# Patient Record
Sex: Female | Born: 1965
Health system: Southern US, Community
[De-identification: ages and names within clinical notes are randomized; demographics above are authoritative.]

## PROBLEM LIST (undated history)

## (undated) DIAGNOSIS — J302 Other seasonal allergic rhinitis: Secondary | ICD-10-CM

## (undated) DIAGNOSIS — Z87442 Personal history of urinary calculi: Secondary | ICD-10-CM

## (undated) DIAGNOSIS — T7840XA Allergy, unspecified, initial encounter: Secondary | ICD-10-CM

## (undated) DIAGNOSIS — K219 Gastro-esophageal reflux disease without esophagitis: Secondary | ICD-10-CM

## (undated) DIAGNOSIS — E785 Hyperlipidemia, unspecified: Secondary | ICD-10-CM

## (undated) HISTORY — DX: Allergy, unspecified, initial encounter: T78.40XA

## (undated) HISTORY — DX: Hyperlipidemia, unspecified: E78.5

## (undated) HISTORY — DX: Gastro-esophageal reflux disease without esophagitis: K21.9

## (undated) HISTORY — DX: Other seasonal allergic rhinitis: J30.2

---

## 1995-01-11 HISTORY — PX: TOTAL ABDOMINAL HYSTERECTOMY: SHX209

## 2000-02-09 ENCOUNTER — Encounter: Payer: Self-pay | Admitting: Family Medicine

## 2000-02-09 ENCOUNTER — Encounter: Admission: RE | Admit: 2000-02-09 | Discharge: 2000-02-09 | Payer: Self-pay | Admitting: Family Medicine

## 2003-02-27 ENCOUNTER — Ambulatory Visit (HOSPITAL_COMMUNITY): Admission: RE | Admit: 2003-02-27 | Discharge: 2003-02-27 | Payer: Self-pay | Admitting: Neurology

## 2003-10-08 ENCOUNTER — Encounter: Admission: RE | Admit: 2003-10-08 | Discharge: 2003-10-08 | Payer: Self-pay | Admitting: Obstetrics and Gynecology

## 2003-12-09 ENCOUNTER — Ambulatory Visit: Payer: Self-pay | Admitting: Family Medicine

## 2004-01-22 ENCOUNTER — Encounter: Admission: RE | Admit: 2004-01-22 | Discharge: 2004-01-22 | Payer: Self-pay | Admitting: *Deleted

## 2004-02-06 ENCOUNTER — Ambulatory Visit: Payer: Self-pay | Admitting: Family Medicine

## 2004-05-06 ENCOUNTER — Ambulatory Visit: Payer: Self-pay | Admitting: Family Medicine

## 2004-09-23 ENCOUNTER — Ambulatory Visit: Payer: Self-pay | Admitting: Family Medicine

## 2004-09-27 ENCOUNTER — Ambulatory Visit (HOSPITAL_COMMUNITY): Admission: RE | Admit: 2004-09-27 | Discharge: 2004-09-27 | Payer: Self-pay | Admitting: Family Medicine

## 2004-10-27 ENCOUNTER — Encounter: Admission: RE | Admit: 2004-10-27 | Discharge: 2004-10-27 | Payer: Self-pay | Admitting: Obstetrics and Gynecology

## 2004-11-11 ENCOUNTER — Ambulatory Visit: Payer: Self-pay | Admitting: Family Medicine

## 2005-05-18 ENCOUNTER — Ambulatory Visit: Payer: Self-pay | Admitting: Family Medicine

## 2005-09-28 ENCOUNTER — Ambulatory Visit: Payer: Self-pay | Admitting: Family Medicine

## 2005-11-03 ENCOUNTER — Encounter: Admission: RE | Admit: 2005-11-03 | Discharge: 2005-11-03 | Payer: Self-pay | Admitting: Obstetrics and Gynecology

## 2006-02-23 ENCOUNTER — Ambulatory Visit: Payer: Self-pay | Admitting: Family Medicine

## 2006-03-29 ENCOUNTER — Ambulatory Visit: Payer: Self-pay | Admitting: Family Medicine

## 2006-09-07 ENCOUNTER — Encounter: Admission: RE | Admit: 2006-09-07 | Discharge: 2006-09-07 | Payer: Self-pay | Admitting: Family Medicine

## 2006-09-20 ENCOUNTER — Ambulatory Visit (HOSPITAL_COMMUNITY): Admission: RE | Admit: 2006-09-20 | Discharge: 2006-09-20 | Payer: Self-pay | Admitting: Family Medicine

## 2006-11-06 ENCOUNTER — Encounter: Admission: RE | Admit: 2006-11-06 | Discharge: 2006-11-06 | Payer: Self-pay | Admitting: Obstetrics and Gynecology

## 2006-11-14 ENCOUNTER — Ambulatory Visit: Payer: Self-pay | Admitting: Internal Medicine

## 2006-11-17 ENCOUNTER — Ambulatory Visit: Payer: Self-pay | Admitting: Internal Medicine

## 2006-11-17 ENCOUNTER — Encounter: Payer: Self-pay | Admitting: Internal Medicine

## 2006-11-17 DIAGNOSIS — K449 Diaphragmatic hernia without obstruction or gangrene: Secondary | ICD-10-CM | POA: Insufficient documentation

## 2007-01-11 HISTORY — PX: LUMBAR SPINE SURGERY: SHX701

## 2007-06-22 ENCOUNTER — Encounter: Admission: RE | Admit: 2007-06-22 | Discharge: 2007-06-22 | Payer: Self-pay | Admitting: Family Medicine

## 2007-07-05 ENCOUNTER — Encounter: Admission: RE | Admit: 2007-07-05 | Discharge: 2007-07-05 | Payer: Self-pay | Admitting: Family Medicine

## 2007-07-19 ENCOUNTER — Ambulatory Visit (HOSPITAL_COMMUNITY): Admission: RE | Admit: 2007-07-19 | Discharge: 2007-07-20 | Payer: Self-pay | Admitting: Neurosurgery

## 2007-11-07 ENCOUNTER — Encounter: Admission: RE | Admit: 2007-11-07 | Discharge: 2007-11-07 | Payer: Self-pay | Admitting: Obstetrics

## 2008-11-02 IMAGING — CR DG LUMBAR SPINE COMPLETE 4+V
5 series · 5 of 5 positions shown · non-contrast
Comparison: KUB of 09/20/2006

CLINICAL DATA: Low back pain, right hip pain and radiculopathy

LUMBAR SPINE - COMPLETE 4+ VIEW

[view not recorded (1 of 5)]
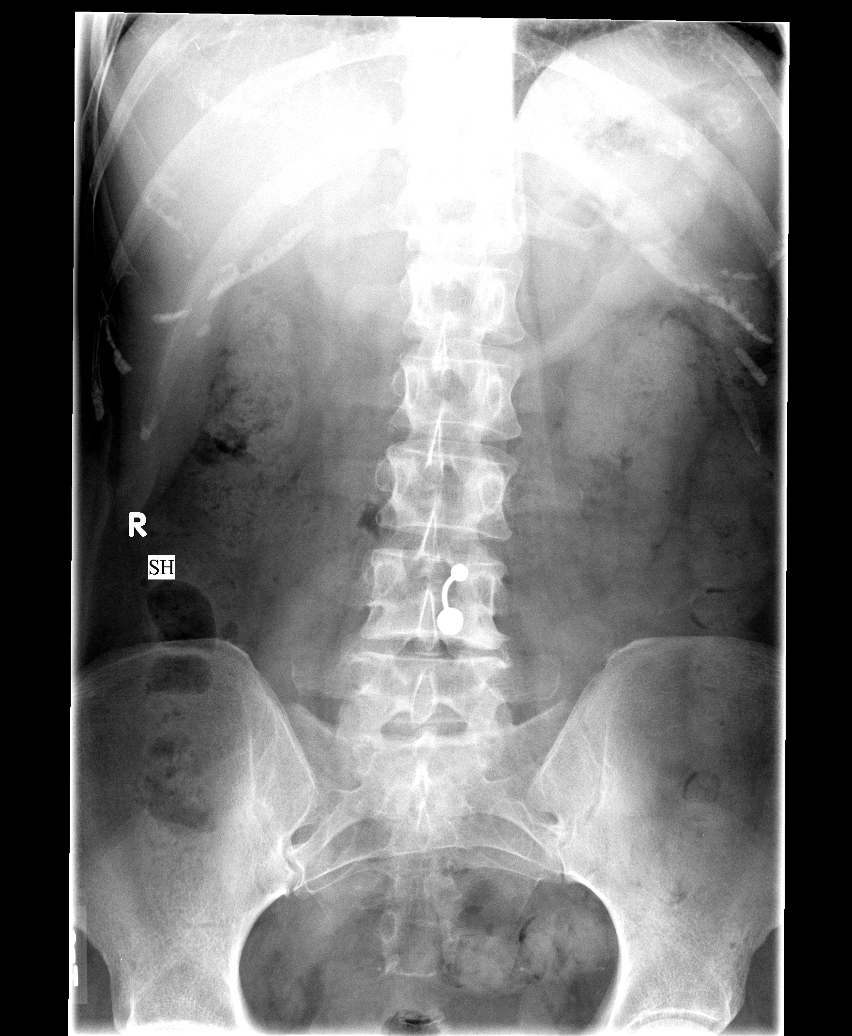

[view not recorded (2 of 5)]
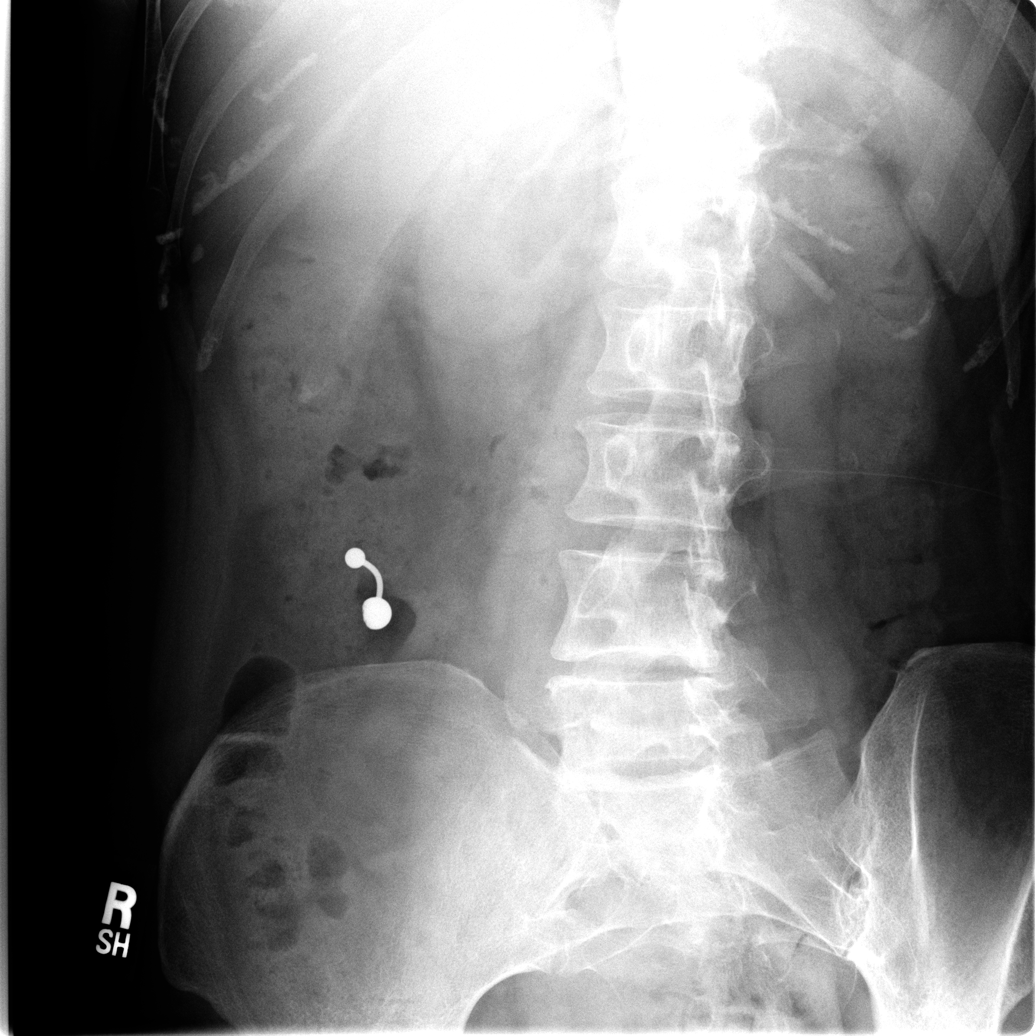

[view not recorded (3 of 5)]
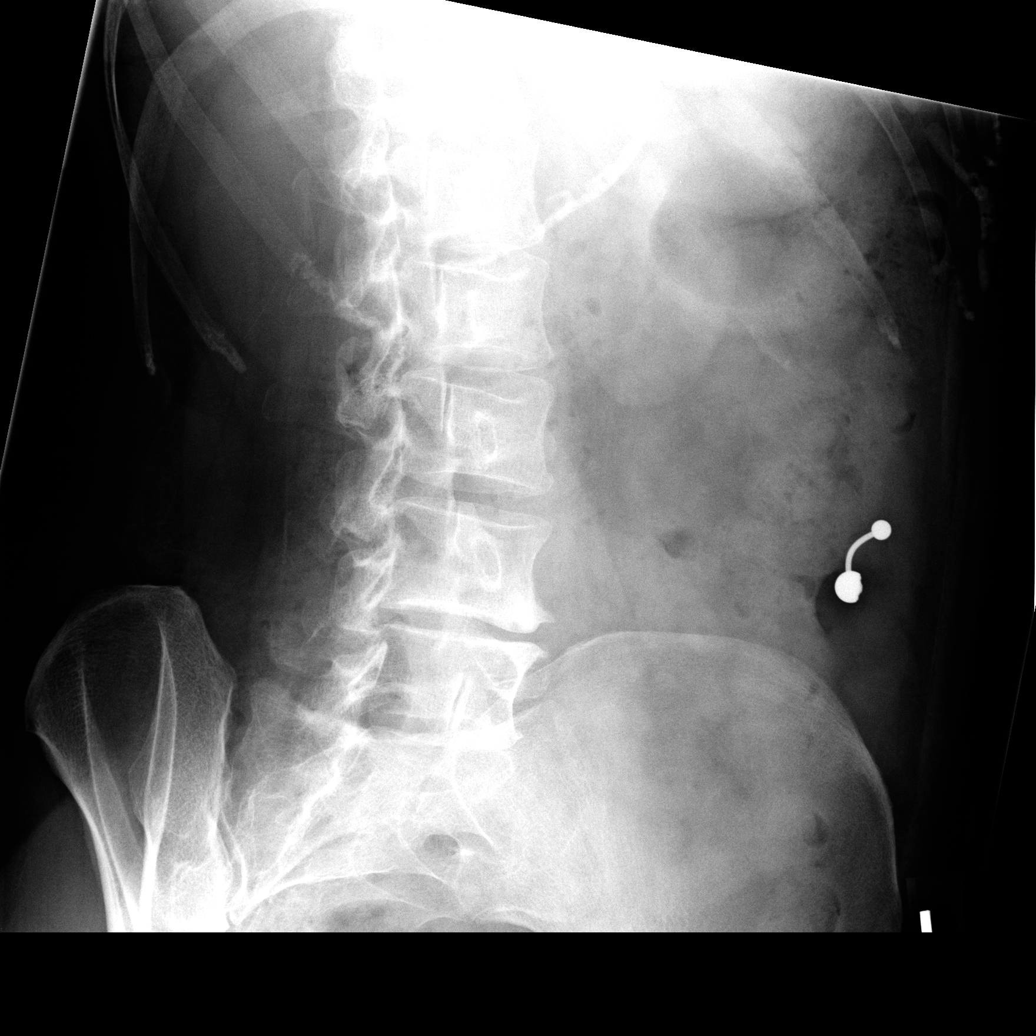

[view not recorded (4 of 5)]
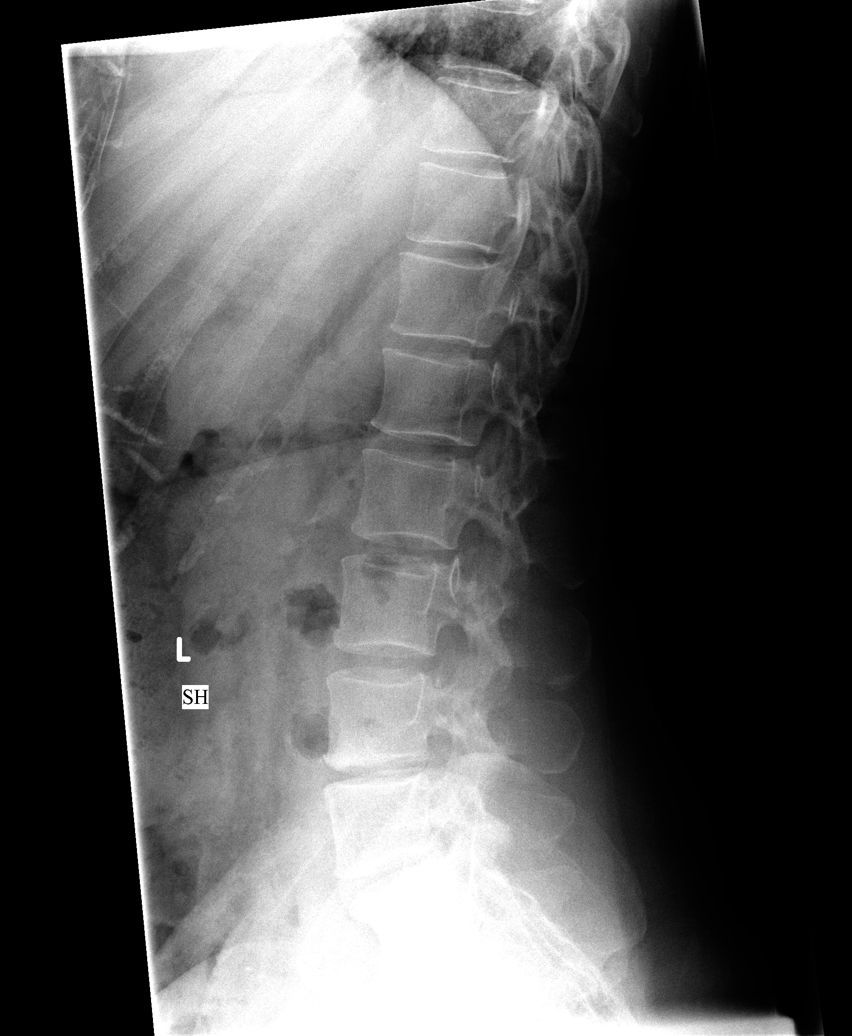

[view not recorded (5 of 5)]
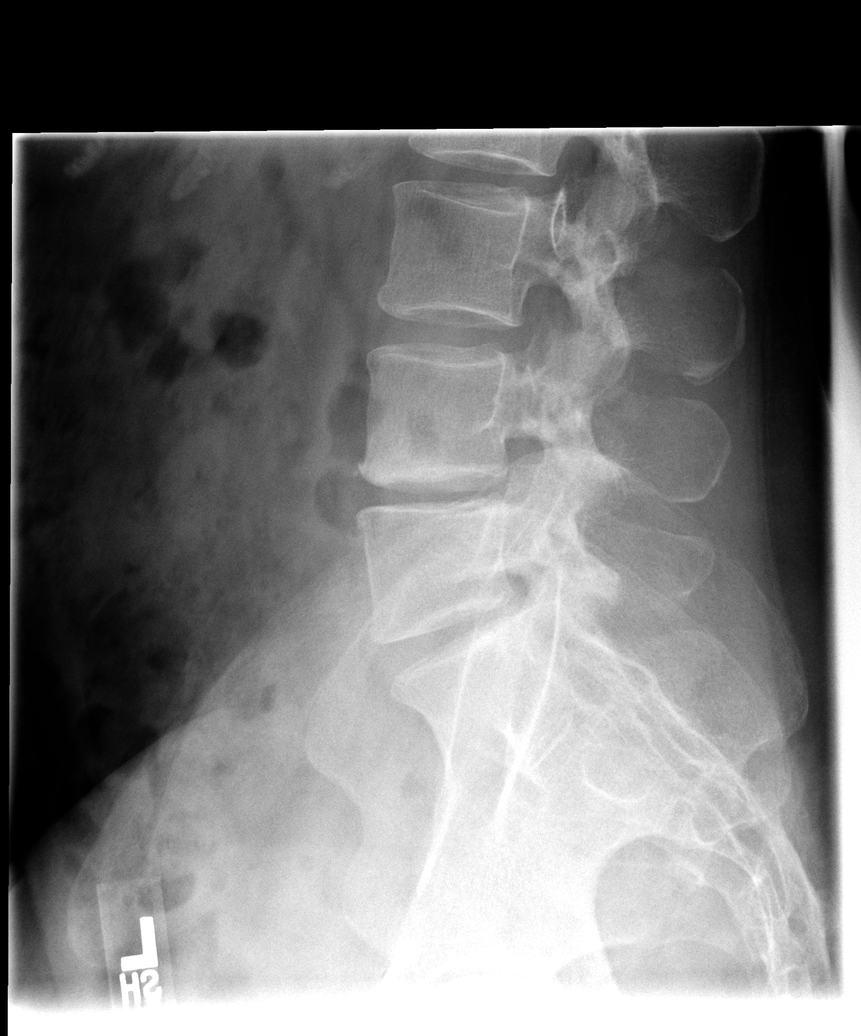

[5 of 5 positions shown; findings below may reference images not displayed]

FINDINGS: The lumbar vertebral are in normal alignment.  There is
degenerative disc disease at L4-5 and L5- S1 levels.  No
compression deformity is seen.  There is some degenerative change
involving the facet joints of L4-5 and L5-S1.  The SI joints appear
normal.
IMPRESSION: Degenerative disc disease at L4-5 and L5-S1.  No acute abnormality.

## 2008-11-07 ENCOUNTER — Encounter: Admission: RE | Admit: 2008-11-07 | Discharge: 2008-11-07 | Payer: Self-pay | Admitting: Obstetrics

## 2009-11-09 ENCOUNTER — Encounter: Admission: RE | Admit: 2009-11-09 | Discharge: 2009-11-09 | Payer: Self-pay | Admitting: Obstetrics

## 2010-01-10 HISTORY — PX: WRIST FRACTURE SURGERY: SHX121

## 2010-01-30 ENCOUNTER — Encounter: Payer: Self-pay | Admitting: Neurology

## 2010-05-10 ENCOUNTER — Telehealth: Payer: Self-pay | Admitting: Internal Medicine

## 2010-05-10 NOTE — Telephone Encounter (Signed)
Patient calling to report upper abdominal pain under breast area for the last couple of weeks and bloating. She tried Pepcid and it helped a little bit. She will change over and try Prilosec OTC BID x 1 week and then daily to see if this helps. She will call back if this does not help. Last EGD 11/4/8- minimal chronic inflammation.

## 2010-05-11 NOTE — Telephone Encounter (Signed)
OK to cont Prelosec 20 mg po qd, if no better, will need an OV

## 2010-05-25 NOTE — Op Note (Signed)
NAME:  Summer Brady, Summer Brady NO.:  [id number]   MEDICAL RECORD NO.:  [id number]          PATIENT TYPE:  OIB   LOCATION:  3528                         FACILITY:  [facility]   PHYSICIAN:  Cristi Loron, M.D.DATE OF BIRTH:  08-12-1965   DATE OF PROCEDURE:  07/19/2007  DATE OF DISCHARGE:                               OPERATIVE REPORT   BRIEF HISTORY:  The patient is a 45 year old white female who has  suffered from back and right leg pain consistent with an L5  radiculopathy.  She failed medical management, was worked up with a  lumbar MRI, which demonstrated the patient had a herniated disk at L4-L5  on the right.  I discussed the various treatment options with the  patient including surgery.  She has weighed the risks, benefits, and  alternatives of surgery, and decided to proceed with a right L4-L5  diskectomy.   PREOPERATIVE DIAGNOSES:  1. Right L4-L5 herniated nucleus pulposus and stenosis.  2. Lumbar radiculopathy/myelopathy.  3. Lumbago.  4. Degenerative disk disease.   POSTOPERATIVE DIAGNOSES:  1. Right L4-L5 herniated nucleus pulposus and stenosis.  2. Lumbar radiculopathy/myelopathy.  3. Lumbago.  4. Degenerative disk disease.   PROCEDURE:  Right L4-L5 diskectomy using microdissection.   SURGEON:  Cristi Loron, MD   ASSISTANT:  Coletta Memos, MD   ANESTHESIA:  General endotracheal.   ESTIMATED BLOOD LOSS:  50 mL.   SPECIMENS:  None.   DRAINS:  None.   COMPLICATIONS:  None.   DESCRIPTION OF PROCEDURE:  The patient was brought to the operating room  by the Anesthesia Team and general endotracheal anesthesia was induced.  The patient was then turned to the prone position on the Wilson frame.  The lumbosacral region was then prepared with Betadine scrub and  Betadine solution.  Sterile drapes were applied.  I then injected the  area to be incised with Marcaine with epinephrine solution.  I used a  scalpel to make a linear midline incision over  the L4-L5 interspace.  I  used electrocautery to perform a right-sided subperiosteal dissection  exposing the right spinous process lamina of L4 and L5.  We obtained  intraoperative radiograph to confirm our location.   We then inserted the [facility] retractor for exposure.  We then brought  the operative microscope into the field and on electromagnification and  illumination, we completed the microdissection/decompression.  We used a  high-speed drill to perform a right L4 laminotomy.  We widened the  laminotomy with Kerrison punches.  We then removed the right L4-L5  ligamentum flavum and then performed a foraminotomy about the right L5  nerve root.  We then used microdissection to free up the thecal sac and  the L5 nerve root from the epidural tissue.  Dr. Franky Macho then gently  retracted these neural structures medially with a Tiko retractor.  This  exposed a large underlying herniated disk.  I incised the disk  herniation with the #15 blade scalpel and removed it with the pituitary  forceps.  We then performed a partial intervertebral diskectomy using  the curettes and  pituitary forceps.  After we were satisfied with the  intervertebral diskectomy, we used a osteophyte tool to remove some  spondylosis from the vertebral endplates of L4-L5 further decompressing  the neural structures.  We then obtained hemostasis with bipolar  electrocautery.  We irrigated the wound out with bacitracin solution.  We then palpated along the ventral surface of the thecal sac with the  coronary dilator, and noted the thecal sac in the L5 nerve root well  decompressed.  We then removed the retractor and then reapproximated the  patient's thoracolumbar fascia with interrupted #1 Vicryl suture, the  subcutaneous tissue with interrupted 2-0 Vicryl suture, and the skin  with Steri-Strips and Benzoin.  The wound was then closed with  bacitracin ointment and sterile dressing was applied.  The drapes were   removed.  The patient was subsequently returned to supine position where  she was extubated by the Anesthesia Team and transported to Post  Anesthesia Care Unit in stable condition.  All sponge, instrument, and  needle counts were correct at the end of this case.      Cristi Loron, M.D.  Electronically Signed     JDJ/MEDQ  D:  07/19/2007  T:  07/20/2007  Job:  [id number]

## 2010-05-25 NOTE — Assessment & Plan Note (Signed)
[facility]                         GASTROENTEROLOGY OFFICE NOTE   NAME:Scaletta, NELSIE DOMINO                         MRN:          [id number]  DATE:11/14/2006                            DOB:          1965/09/21    GI CONSULTATION   Ms. Costley is a very nice 45 year old white female who is here today for  evaluation of a several month history of bloating, epigastric and right  upper quadrant abdominal discomfort which started gradually this past  summer.  The patient describes discomfort radiating to the back and to  the right scapula.  She saw Dr. Lysbeth Galas and gave a positive family  history of gallbladder disease in her mother who had a cholecystectomy  at the age of 85 and brother who had a cholecystectomy at the age of 4,  both for cholelithiasis.  Upper abdominal ultrasound showed normal  common bile duct, normal gallbladder with normal wall of 2 mm in  diameter.  The common bile duct measured 5 mm, and there were no stones.  She was put on Prevacid 30 mg but took only 5 pills in the last 2  months.  Symptoms have improved about 50%.  She was taking Advil at  least 3 several times a day for ear problems.  She since then has  discontinued the Advil.  She is overall about 50% improved, but has not  taken any Prevacid.  She also has tried Print production planner.  Upper GI series  following upper abdominal ultrasound was abnormal in that it showed  abnormal rugae along the body of the stomach with nonspecific  inflammatory changes, raising the possibility of infiltrating lesion in  the stomach versus gastritis.  She denies any specific lower GI problems  other than irregular bowel habits.   MEDICATIONS:  1. Allegra 180 mg p.o. daily.  2. Singulair.  3. Lexapro 10 mg half tablet daily.  4. Multivitamins.   PAST HISTORY:  1. Kidney stone at the age of 45.  2. Hysterectomy 11 years ago.   FAMILY HISTORY:  Negative for colon cancer.  Positive for diabetes in  grandparents  and father.  Prostate cancer in father.   SOCIAL HISTORY:  Married.  No children.  Works in the office.  Does not  drink alcohol and does not smoke.   REVIEW OF SYSTEMS:  Essentially negative.   PHYSICAL EXAMINATION:  VITAL SIGNS:  Blood pressure 196/66, pulse 72 and  weight 146 pounds.  GENERAL:  She was alert, oriented and in no acute distress.  Healthy  appearance.  HEENT:  Sclerae nonicteric.  NECK:  Supple.  No lymphadenopathy.  LUNGS:  Clear to auscultation.  There was no tenderness over the  scapula.  COR:  Normal S1, normal S2.  ABDOMEN:  Soft.  I could not elicit any tenderness or pain, although  there was some discomfort when pounding over the liver.  There was no  focal sign.  Liver edge was at the costal margin.  Lower abdomen was  unremarkable.  Bowel sounds were normoactive.  RECTAL:  Soft, hemoccult negative stool.  EXTREMITIES:  No edema.  IMPRESSION:  A 45 year old white female with gradual onset of epigastric  and right upper quadrant discomfort which since then has improved about  50% after discontinuation of her Advil and taking a few tablets of  Prevacid.  Although she has a strong family history of gallbladder  disease in both mother and brother, her ultrasound is essentially  negative.  Her symptoms are quite nonspecific and could be  representative of either gastritis or possible biliary dysfunction.  Her  upper GI series suggests inflammatory process in the antrum.  Cannot  rule out infiltrating process.   PLAN:  1. Start Prevacid one a day 30 mg on a regular basis.  2. Stay off Advil.  3. Upper endoscopy scheduled.  We plan to do H. pylori.  4. If endoscopy negative and Prevacid does not help her symptoms,      consider HIDA scan with CCK.     Hedwig Morton. Juanda Chance, MD  Electronically Signed    DMB/MedQ  DD: 11/14/2006  DT: 11/15/2006  Job #: [id number]   cc:   Delaney Meigs, M.D.

## 2010-06-30 ENCOUNTER — Ambulatory Visit (INDEPENDENT_AMBULATORY_CARE_PROVIDER_SITE_OTHER): Payer: 59 | Admitting: Orthopedic Surgery

## 2010-06-30 ENCOUNTER — Encounter: Payer: Self-pay | Admitting: Orthopedic Surgery

## 2010-06-30 VITALS — Resp 16 | Ht 67.0 in | Wt 158.0 lb

## 2010-06-30 DIAGNOSIS — M25522 Pain in left elbow: Secondary | ICD-10-CM

## 2010-06-30 DIAGNOSIS — M25529 Pain in unspecified elbow: Secondary | ICD-10-CM

## 2010-06-30 DIAGNOSIS — S52599A Other fractures of lower end of unspecified radius, initial encounter for closed fracture: Secondary | ICD-10-CM

## 2010-06-30 DIAGNOSIS — S52509A Unspecified fracture of the lower end of unspecified radius, initial encounter for closed fracture: Secondary | ICD-10-CM | POA: Insufficient documentation

## 2010-06-30 MED ORDER — HYDROCODONE-ACETAMINOPHEN 5-500 MG PO TABS: 1.0000 | ORAL_TABLET | Freq: Every day | ORAL | Status: DC

## 2010-06-30 MED ORDER — HYDROCODONE-ACETAMINOPHEN 5-500 MG PO TABS: 1.0000 | ORAL_TABLET | ORAL | Status: AC | PRN

## 2010-06-30 NOTE — Progress Notes (Signed)
x-ray, LEFT elbow, AP and lateral LEFT elbow. LEFT elbow pain and some restriction of motion.   films do not show a fracture but clinical correlation is suggested.  Addendum on clinical exam, the patient's elbow flexion was limited 220 she lost 10 of extension. Had tenderness over the radial head.  Suspect hairline fracture not visible on x-ray.

## 2010-06-30 NOTE — Progress Notes (Signed)
Addended by: Fuller Canada, MD E on: 06/30/2010 11:11 AM   Modules accepted: Orders

## 2010-06-30 NOTE — Progress Notes (Signed)
   New patient  ER for followup  45 years old slipped landed on her RIGHT hand complains of severe nonradiating RIGHT wrist pain with deformity, swelling.  There is an abrasion over the ulna.  This is not an open fracture.  She went to the more head the emergency room and came here for her followup visit.  Her splint was removed in the office and a splint was applied with a dressing over the abrasion which is over the ulna.  History as recorded  Review of systems negative except for seasonal ALLERGY  Vital signs are stable as recorded  General appearance is normal  The patient is alert and oriented x3  The patient's mood and affect are normal  The patient is ambulating with a normal gait pattern  The cardiovascular exam reveals normal pulses and temperature without edema swelling.  The lymphatic system is negative for palpable lymph nodes  The sensory exam is normal.  There are no pathologic reflexes.  Balance is normal.  RIGHT wrist.  There is tenderness over the distal radius with deformity.  Painful range of motion but I was able to get her in 20 of extension at the wrist.  The wrist joint itself is stable.  Muscle strength could not be assessed.  Abrasion over the ulna.  Her other extremities are aligned properly without contracture subluxation atrophy or tremor  3 views from the ER show an intra-articular fracture the distal radius with shortening comminution and angulation.  Plan I have recommended that the patient see a hand specialist for treatment of a severe fracture and avulsed recommended that she be tested for osteoporosis  She did have a hysterectomy and did indicate that she was supposed to be on calcium but has not pursued that.

## 2010-09-06 ENCOUNTER — Other Ambulatory Visit: Payer: Self-pay | Admitting: Obstetrics

## 2010-09-06 DIAGNOSIS — N760 Acute vaginitis: Secondary | ICD-10-CM

## 2010-09-06 DIAGNOSIS — T148XXA Other injury of unspecified body region, initial encounter: Secondary | ICD-10-CM

## 2010-09-06 DIAGNOSIS — Z1231 Encounter for screening mammogram for malignant neoplasm of breast: Secondary | ICD-10-CM

## 2010-10-07 LAB — CBC
HCT: 39.7
Hemoglobin: 13.7
MCHC: 34.5
Platelets: 223
RDW: 12.4

## 2010-11-30 ENCOUNTER — Ambulatory Visit
Admission: RE | Admit: 2010-11-30 | Discharge: 2010-11-30 | Disposition: A | Discharge status: Home / Self Care | Payer: 59 | Source: Ambulatory Visit | Attending: Obstetrics | Admitting: Obstetrics

## 2010-11-30 DIAGNOSIS — N760 Acute vaginitis: Secondary | ICD-10-CM

## 2010-11-30 DIAGNOSIS — T148XXA Other injury of unspecified body region, initial encounter: Secondary | ICD-10-CM

## 2010-11-30 DIAGNOSIS — Z1231 Encounter for screening mammogram for malignant neoplasm of breast: Secondary | ICD-10-CM

## 2011-10-24 ENCOUNTER — Other Ambulatory Visit: Payer: Self-pay | Admitting: Obstetrics

## 2011-10-24 DIAGNOSIS — Z1231 Encounter for screening mammogram for malignant neoplasm of breast: Secondary | ICD-10-CM

## 2011-12-01 ENCOUNTER — Ambulatory Visit
Admission: RE | Admit: 2011-12-01 | Discharge: 2011-12-01 | Disposition: A | Discharge status: Home / Self Care | Payer: 59 | Source: Ambulatory Visit | Attending: Obstetrics | Admitting: Obstetrics

## 2011-12-01 DIAGNOSIS — Z1231 Encounter for screening mammogram for malignant neoplasm of breast: Secondary | ICD-10-CM

## 2012-10-22 ENCOUNTER — Other Ambulatory Visit: Payer: Self-pay

## 2012-10-22 DIAGNOSIS — Z1231 Encounter for screening mammogram for malignant neoplasm of breast: Secondary | ICD-10-CM

## 2012-12-05 ENCOUNTER — Ambulatory Visit: Payer: 59

## 2013-01-01 ENCOUNTER — Ambulatory Visit
Admission: RE | Admit: 2013-01-01 | Discharge: 2013-01-01 | Disposition: A | Discharge status: Home / Self Care | Payer: 59 | Source: Ambulatory Visit

## 2013-01-01 DIAGNOSIS — Z1231 Encounter for screening mammogram for malignant neoplasm of breast: Secondary | ICD-10-CM

## 2013-09-10 HISTORY — PX: MOHS SURGERY: SUR867

## 2013-10-01 ENCOUNTER — Other Ambulatory Visit: Payer: Self-pay

## 2013-10-01 DIAGNOSIS — Z1231 Encounter for screening mammogram for malignant neoplasm of breast: Secondary | ICD-10-CM

## 2013-10-31 ENCOUNTER — Ambulatory Visit (AMBULATORY_SURGERY_CENTER): Payer: Self-pay | Admitting: *Deleted

## 2013-10-31 VITALS — Ht 67.0 in | Wt 175.6 lb

## 2013-10-31 DIAGNOSIS — Z1211 Encounter for screening for malignant neoplasm of colon: Secondary | ICD-10-CM

## 2013-10-31 MED ORDER — MOVIPREP 100 G PO SOLR: ORAL | Status: DC

## 2013-10-31 NOTE — Progress Notes (Signed)
No allergies to eggs or soy. No problems with anesthesia.  Pt given Emmi instructions for colonoscopy  No oxygen use  No diet drug use  

## 2013-11-07 ENCOUNTER — Encounter: Payer: Self-pay | Admitting: Internal Medicine

## 2013-11-15 ENCOUNTER — Encounter: Payer: Self-pay | Admitting: Internal Medicine

## 2013-11-15 ENCOUNTER — Ambulatory Visit (AMBULATORY_SURGERY_CENTER): Payer: 59 | Admitting: Internal Medicine

## 2013-11-15 VITALS — BP 125/82 | HR 85 | Temp 99.0°F | Resp 17 | Ht 67.0 in | Wt 175.0 lb

## 2013-11-15 DIAGNOSIS — Z1211 Encounter for screening for malignant neoplasm of colon: Secondary | ICD-10-CM

## 2013-11-15 DIAGNOSIS — D125 Benign neoplasm of sigmoid colon: Secondary | ICD-10-CM

## 2013-11-15 DIAGNOSIS — K635 Polyp of colon: Secondary | ICD-10-CM

## 2013-11-15 MED ORDER — SODIUM CHLORIDE 0.9 % IV SOLN: 500.0000 mL | INTRAVENOUS | Status: DC

## 2013-11-15 NOTE — Op Note (Signed)
[place]  [address] [place], [id number]   COLONOSCOPY PROCEDURE REPORT  PATIENT: Summer Brady, Summer Brady  MR#: [id number] BIRTHDATE: [birth year] , 48  yrs. old GENDER: female ENDOSCOPIST: Lafayette Dragon, MD REFERRED FV:[id number] Nyland, M.D. PROCEDURE DATE:  11/15/2013 PROCEDURE:   Colonoscopy with cold biopsy polypectomy First Screening Colonoscopy - Avg.  risk and is 50 yrs.  old or older Yes.  Prior Negative Screening - Now for repeat screening. N/A  History of Adenoma - Now for follow-up colonoscopy & has been > or = to 3 yrs.  N/A  Polyps Removed Today? Yes. ASA CLASS:   Class I INDICATIONS:average risk for colon cancer. MEDICATIONS: Monitored anesthesia care and Propofol 300 mg IV  DESCRIPTION OF PROCEDURE:   After the risks benefits and alternatives of the procedure were thoroughly explained, informed consent was obtained.  The digital rectal exam revealed no abnormalities of the rectum.   The LB 1528  endoscope was introduced through the anus and advanced to the cecum, which was identified by both the appendix and ileocecal valve. No adverse events experienced.   The quality of the prep was good, using MoviPrep  The instrument was then slowly withdrawn as the colon was fully examined.      COLON FINDINGS: A sessile polyp measuring 3 mm in size was found in the sigmoid colon.  A polypectomy was performed with cold forceps. The resection was complete, the polyp tissue was completely retrieved and sent to histology.  Retroflexed views revealed no abnormalities. The time to cecum=3 minutes 47 seconds.  Withdrawal time=6 minutes 02 seconds.  The scope was withdrawn and the procedure completed. COMPLICATIONS: There were no immediate complications.  ENDOSCOPIC IMPRESSION: Sessile polyp was found in the sigmoid colon; polypectomy was performed with cold forceps  RECOMMENDATIONS: 1.  Await pathology results 2.  High-fiber diet Recall colonoscopy pending path  report  eSigned:  Lafayette Dragon, MD 11/15/2013 9:32 AM   cc:

## 2013-11-15 NOTE — Progress Notes (Signed)
Called to room to assist during endoscopic procedure.  Patient ID and intended procedure confirmed with present staff. Received instructions for my participation in the procedure from the performing physician.  

## 2013-11-15 NOTE — Progress Notes (Signed)
Patient awakening,vss,report to rn 

## 2013-11-15 NOTE — Patient Instructions (Signed)

## 2013-11-18 ENCOUNTER — Telehealth: Payer: Self-pay

## 2013-11-18 NOTE — Telephone Encounter (Signed)
  Follow up Call-  Call back number 11/15/2013  Post procedure Call Back phone  # [phone]  Permission to leave phone message Yes     Patient questions:  Do you have a fever, pain , or abdominal swelling? No. Pain Score  0 *  Have you tolerated food without any problems? Yes.    Have you been able to return to your normal activities? Yes.    Do you have any questions about your discharge instructions: Diet   No. Medications  No. Follow up visit  No.  Do you have questions or concerns about your Care? No.  Actions: * If pain score is 4 or above: No action needed, pain <4.

## 2013-11-20 ENCOUNTER — Encounter: Payer: Self-pay | Admitting: Internal Medicine

## 2014-01-07 ENCOUNTER — Ambulatory Visit
Admission: RE | Admit: 2014-01-07 | Discharge: 2014-01-07 | Disposition: A | Discharge status: Home / Self Care | Payer: 59 | Source: Ambulatory Visit

## 2014-01-07 DIAGNOSIS — Z1231 Encounter for screening mammogram for malignant neoplasm of breast: Secondary | ICD-10-CM

## 2014-01-08 ENCOUNTER — Other Ambulatory Visit: Payer: Self-pay | Admitting: Obstetrics

## 2014-01-08 DIAGNOSIS — R928 Other abnormal and inconclusive findings on diagnostic imaging of breast: Secondary | ICD-10-CM

## 2014-01-21 ENCOUNTER — Ambulatory Visit
Admission: RE | Admit: 2014-01-21 | Discharge: 2014-01-21 | Disposition: A | Discharge status: Home / Self Care | Payer: 59 | Source: Ambulatory Visit | Attending: Obstetrics | Admitting: Obstetrics

## 2014-01-21 DIAGNOSIS — R928 Other abnormal and inconclusive findings on diagnostic imaging of breast: Secondary | ICD-10-CM

## 2014-06-18 DIAGNOSIS — M549 Dorsalgia, unspecified: Secondary | ICD-10-CM | POA: Insufficient documentation

## 2014-12-19 ENCOUNTER — Other Ambulatory Visit: Payer: Self-pay

## 2014-12-19 DIAGNOSIS — Z1231 Encounter for screening mammogram for malignant neoplasm of breast: Secondary | ICD-10-CM

## 2015-01-27 ENCOUNTER — Ambulatory Visit
Admission: RE | Admit: 2015-01-27 | Discharge: 2015-01-27 | Disposition: A | Discharge status: Home / Self Care | Payer: 59 | Source: Ambulatory Visit

## 2015-01-27 DIAGNOSIS — Z1231 Encounter for screening mammogram for malignant neoplasm of breast: Secondary | ICD-10-CM

## 2015-12-24 ENCOUNTER — Other Ambulatory Visit: Payer: Self-pay | Admitting: Obstetrics

## 2015-12-24 DIAGNOSIS — Z1231 Encounter for screening mammogram for malignant neoplasm of breast: Secondary | ICD-10-CM

## 2016-01-29 ENCOUNTER — Ambulatory Visit: Payer: 59

## 2016-02-12 ENCOUNTER — Ambulatory Visit
Admission: RE | Admit: 2016-02-12 | Discharge: 2016-02-12 | Disposition: A | Discharge status: Home / Self Care | Payer: 59 | Source: Ambulatory Visit | Attending: Obstetrics | Admitting: Obstetrics

## 2016-02-12 DIAGNOSIS — Z1231 Encounter for screening mammogram for malignant neoplasm of breast: Secondary | ICD-10-CM

## 2016-02-15 ENCOUNTER — Other Ambulatory Visit: Payer: Self-pay | Admitting: Obstetrics

## 2016-02-15 DIAGNOSIS — R928 Other abnormal and inconclusive findings on diagnostic imaging of breast: Secondary | ICD-10-CM

## 2016-02-19 ENCOUNTER — Ambulatory Visit
Admission: RE | Admit: 2016-02-19 | Discharge: 2016-02-19 | Disposition: A | Discharge status: Home / Self Care | Payer: 59 | Source: Ambulatory Visit | Attending: Obstetrics | Admitting: Obstetrics

## 2016-02-19 ENCOUNTER — Other Ambulatory Visit: Payer: Self-pay | Admitting: Obstetrics

## 2016-02-19 DIAGNOSIS — N6489 Other specified disorders of breast: Secondary | ICD-10-CM

## 2016-02-19 DIAGNOSIS — R928 Other abnormal and inconclusive findings on diagnostic imaging of breast: Secondary | ICD-10-CM

## 2016-02-22 ENCOUNTER — Ambulatory Visit
Admission: RE | Admit: 2016-02-22 | Discharge: 2016-02-22 | Disposition: A | Discharge status: Home / Self Care | Payer: 59 | Source: Ambulatory Visit | Attending: Obstetrics | Admitting: Obstetrics

## 2016-02-22 ENCOUNTER — Other Ambulatory Visit: Payer: Self-pay | Admitting: Obstetrics

## 2016-02-22 DIAGNOSIS — R928 Other abnormal and inconclusive findings on diagnostic imaging of breast: Secondary | ICD-10-CM

## 2016-02-22 DIAGNOSIS — N6489 Other specified disorders of breast: Secondary | ICD-10-CM

## 2016-03-08 ENCOUNTER — Other Ambulatory Visit: Payer: Self-pay | Admitting: General Surgery

## 2016-03-08 DIAGNOSIS — N6320 Unspecified lump in the left breast, unspecified quadrant: Secondary | ICD-10-CM

## 2016-03-10 HISTORY — PX: BREAST BIOPSY: SHX20

## 2016-03-14 ENCOUNTER — Other Ambulatory Visit: Payer: Self-pay | Admitting: General Surgery

## 2016-03-14 DIAGNOSIS — N6320 Unspecified lump in the left breast, unspecified quadrant: Secondary | ICD-10-CM

## 2016-03-25 ENCOUNTER — Encounter (HOSPITAL_BASED_OUTPATIENT_CLINIC_OR_DEPARTMENT_OTHER): Payer: Self-pay | Admitting: *Deleted

## 2016-03-25 NOTE — Progress Notes (Signed)
Bring all medications. To pick up Boost on Tuesday before seed placed.

## 2016-03-29 ENCOUNTER — Ambulatory Visit
Admission: RE | Admit: 2016-03-29 | Discharge: 2016-03-29 | Disposition: A | Discharge status: Home / Self Care | Payer: 59 | Source: Ambulatory Visit | Attending: General Surgery | Admitting: General Surgery

## 2016-03-29 DIAGNOSIS — N6320 Unspecified lump in the left breast, unspecified quadrant: Secondary | ICD-10-CM

## 2016-03-29 NOTE — Progress Notes (Signed)
Boost drink given with instructions to complete by [facility], pt verbalized understanding.

## 2016-03-31 ENCOUNTER — Ambulatory Visit
Admission: RE | Admit: 2016-03-31 | Discharge: 2016-03-31 | Disposition: A | Discharge status: Home / Self Care | Payer: 59 | Source: Ambulatory Visit | Attending: General Surgery | Admitting: General Surgery

## 2016-03-31 ENCOUNTER — Encounter (HOSPITAL_BASED_OUTPATIENT_CLINIC_OR_DEPARTMENT_OTHER): Payer: Self-pay | Admitting: Anesthesiology

## 2016-03-31 ENCOUNTER — Encounter (HOSPITAL_BASED_OUTPATIENT_CLINIC_OR_DEPARTMENT_OTHER)
Admission: RE | Disposition: A | Discharge status: Home / Self Care | Payer: Self-pay | Source: Ambulatory Visit | Attending: General Surgery

## 2016-03-31 ENCOUNTER — Ambulatory Visit (HOSPITAL_BASED_OUTPATIENT_CLINIC_OR_DEPARTMENT_OTHER): Payer: 59 | Admitting: Anesthesiology

## 2016-03-31 ENCOUNTER — Ambulatory Visit (HOSPITAL_BASED_OUTPATIENT_CLINIC_OR_DEPARTMENT_OTHER)
Admission: RE | Admit: 2016-03-31 | Discharge: 2016-03-31 | Disposition: A | Discharge status: Home / Self Care | Payer: 59 | Source: Ambulatory Visit | Attending: General Surgery | Admitting: General Surgery

## 2016-03-31 DIAGNOSIS — Z881 Allergy status to other antibiotic agents status: Secondary | ICD-10-CM | POA: Insufficient documentation

## 2016-03-31 DIAGNOSIS — K219 Gastro-esophageal reflux disease without esophagitis: Secondary | ICD-10-CM | POA: Insufficient documentation

## 2016-03-31 DIAGNOSIS — Z8249 Family history of ischemic heart disease and other diseases of the circulatory system: Secondary | ICD-10-CM | POA: Diagnosis not present

## 2016-03-31 DIAGNOSIS — Z8042 Family history of malignant neoplasm of prostate: Secondary | ICD-10-CM | POA: Diagnosis not present

## 2016-03-31 DIAGNOSIS — Z90710 Acquired absence of both cervix and uterus: Secondary | ICD-10-CM | POA: Insufficient documentation

## 2016-03-31 DIAGNOSIS — Z91040 Latex allergy status: Secondary | ICD-10-CM | POA: Diagnosis not present

## 2016-03-31 DIAGNOSIS — N6320 Unspecified lump in the left breast, unspecified quadrant: Secondary | ICD-10-CM | POA: Diagnosis not present

## 2016-03-31 DIAGNOSIS — Z79899 Other long term (current) drug therapy: Secondary | ICD-10-CM | POA: Diagnosis not present

## 2016-03-31 DIAGNOSIS — Z833 Family history of diabetes mellitus: Secondary | ICD-10-CM | POA: Diagnosis not present

## 2016-03-31 DIAGNOSIS — N6092 Unspecified benign mammary dysplasia of left breast: Secondary | ICD-10-CM | POA: Insufficient documentation

## 2016-03-31 HISTORY — DX: Personal history of urinary calculi: Z87.442

## 2016-03-31 HISTORY — PX: RADIOACTIVE SEED GUIDED EXCISIONAL BREAST BIOPSY: SHX6490

## 2016-03-31 SURGERY — RADIOACTIVE SEED GUIDED BREAST BIOPSY
Anesthesia: General | Site: Breast | Laterality: Left

## 2016-03-31 MED ORDER — HYDROMORPHONE HCL 1 MG/ML IJ SOLN
0.2500 mg | INTRAMUSCULAR | Status: DC | PRN
Start: 2016-03-31 — End: 2016-03-31

## 2016-03-31 MED ORDER — DEXAMETHASONE SODIUM PHOSPHATE 4 MG/ML IJ SOLN
INTRAMUSCULAR | Status: DC | PRN
  Administered 2016-03-31: 10 mg via INTRAVENOUS

## 2016-03-31 MED ORDER — FENTANYL CITRATE (PF) 100 MCG/2ML IJ SOLN
INTRAMUSCULAR | Status: AC
  Filled 2016-03-31: qty 2

## 2016-03-31 MED ORDER — VANCOMYCIN HCL IN DEXTROSE 1-5 GM/200ML-% IV SOLN
1000.0000 mg | INTRAVENOUS | Status: AC
  Administered 2016-03-31: 1000 mg via INTRAVENOUS

## 2016-03-31 MED ORDER — ACETAMINOPHEN 500 MG PO TABS
ORAL_TABLET | ORAL | Status: AC
  Filled 2016-03-31: qty 2

## 2016-03-31 MED ORDER — ONDANSETRON HCL 4 MG/2ML IJ SOLN
INTRAMUSCULAR | Status: AC
  Filled 2016-03-31: qty 2

## 2016-03-31 MED ORDER — BUPIVACAINE HCL (PF) 0.25 % IJ SOLN
INTRAMUSCULAR | Status: DC | PRN
  Administered 2016-03-31: 10 mL

## 2016-03-31 MED ORDER — GABAPENTIN 300 MG PO CAPS
300.0000 mg | ORAL_CAPSULE | ORAL | Status: AC
  Administered 2016-03-31: 300 mg via ORAL

## 2016-03-31 MED ORDER — BUPIVACAINE HCL (PF) 0.25 % IJ SOLN
INTRAMUSCULAR | Status: AC
  Filled 2016-03-31: qty 30

## 2016-03-31 MED ORDER — GABAPENTIN 300 MG PO CAPS
ORAL_CAPSULE | ORAL | Status: AC
  Filled 2016-03-31: qty 1

## 2016-03-31 MED ORDER — LACTATED RINGERS IV SOLN
INTRAVENOUS | Status: DC
  Administered 2016-03-31 (×2): via INTRAVENOUS

## 2016-03-31 MED ORDER — MIDAZOLAM HCL 2 MG/2ML IJ SOLN
1.0000 mg | INTRAMUSCULAR | Status: DC | PRN
  Administered 2016-03-31: 2 mg via INTRAVENOUS

## 2016-03-31 MED ORDER — ACETAMINOPHEN 500 MG PO TABS
1000.0000 mg | ORAL_TABLET | ORAL | Status: AC
  Administered 2016-03-31: 1000 mg via ORAL

## 2016-03-31 MED ORDER — CELECOXIB 200 MG PO CAPS
200.0000 mg | ORAL_CAPSULE | ORAL | Status: AC
  Administered 2016-03-31: 200 mg via ORAL

## 2016-03-31 MED ORDER — SCOPOLAMINE 1 MG/3DAYS TD PT72: 1.0000 | MEDICATED_PATCH | TRANSDERMAL | Status: DC

## 2016-03-31 MED ORDER — PROPOFOL 10 MG/ML IV BOLUS
INTRAVENOUS | Status: DC | PRN
  Administered 2016-03-31: 170 mg via INTRAVENOUS

## 2016-03-31 MED ORDER — VANCOMYCIN HCL IN DEXTROSE 1-5 GM/200ML-% IV SOLN
INTRAVENOUS | Status: AC
  Filled 2016-03-31: qty 200

## 2016-03-31 MED ORDER — SCOPOLAMINE 1 MG/3DAYS TD PT72
1.0000 | MEDICATED_PATCH | Freq: Once | TRANSDERMAL | Status: DC | PRN
  Administered 2016-03-31: 1.5 mg via TRANSDERMAL

## 2016-03-31 MED ORDER — DEXAMETHASONE SODIUM PHOSPHATE 10 MG/ML IJ SOLN
INTRAMUSCULAR | Status: AC
  Filled 2016-03-31: qty 1

## 2016-03-31 MED ORDER — LIDOCAINE 2% (20 MG/ML) 5 ML SYRINGE
INTRAMUSCULAR | Status: AC
  Filled 2016-03-31: qty 5

## 2016-03-31 MED ORDER — ONDANSETRON HCL 4 MG/2ML IJ SOLN
INTRAMUSCULAR | Status: DC | PRN
  Administered 2016-03-31: 4 mg via INTRAVENOUS

## 2016-03-31 MED ORDER — CHLORHEXIDINE GLUCONATE CLOTH 2 % EX PADS: 6.0000 | MEDICATED_PAD | Freq: Once | CUTANEOUS | Status: DC

## 2016-03-31 MED ORDER — PROPOFOL 10 MG/ML IV BOLUS
INTRAVENOUS | Status: AC
  Filled 2016-03-31: qty 20

## 2016-03-31 MED ORDER — CELECOXIB 200 MG PO CAPS
ORAL_CAPSULE | ORAL | Status: AC
  Filled 2016-03-31: qty 1

## 2016-03-31 MED ORDER — SCOPOLAMINE 1 MG/3DAYS TD PT72
MEDICATED_PATCH | TRANSDERMAL | Status: AC
  Filled 2016-03-31: qty 1

## 2016-03-31 MED ORDER — LIDOCAINE HCL (CARDIAC) 20 MG/ML IV SOLN
INTRAVENOUS | Status: DC | PRN
  Administered 2016-03-31: 80 mg via INTRAVENOUS

## 2016-03-31 MED ORDER — OXYCODONE HCL 5 MG PO TABS
5.0000 mg | ORAL_TABLET | Freq: Once | ORAL | Status: AC
  Administered 2016-03-31: 5 mg via ORAL

## 2016-03-31 MED ORDER — FENTANYL CITRATE (PF) 100 MCG/2ML IJ SOLN
50.0000 ug | INTRAMUSCULAR | Status: DC | PRN
  Administered 2016-03-31: 100 ug via INTRAVENOUS

## 2016-03-31 MED ORDER — MIDAZOLAM HCL 2 MG/2ML IJ SOLN
INTRAMUSCULAR | Status: AC
  Filled 2016-03-31: qty 2

## 2016-03-31 MED ORDER — OXYCODONE-ACETAMINOPHEN 10-325 MG PO TABS: 1.0000 | ORAL_TABLET | Freq: Four times a day (QID) | ORAL | 0 refills | Status: AC | PRN

## 2016-03-31 MED ORDER — OXYCODONE HCL 5 MG PO TABS
ORAL_TABLET | ORAL | Status: AC
  Filled 2016-03-31: qty 1

## 2016-03-31 MED ORDER — PROMETHAZINE HCL 25 MG/ML IJ SOLN: 6.2500 mg | INTRAMUSCULAR | Status: DC | PRN

## 2016-03-31 SURGICAL SUPPLY — 42 items
BINDER BREAST LRG (GAUZE/BANDAGES/DRESSINGS) ×3 IMPLANT
BLADE SURG 15 STRL LF DISP TIS (BLADE) ×1 IMPLANT
BLADE SURG 15 STRL SS (BLADE) ×2
CHLORAPREP W/TINT 26ML (MISCELLANEOUS) ×3 IMPLANT
CLIP TI WIDE RED SMALL 6 (CLIP) ×3 IMPLANT
CLOSURE WOUND 1/2 X4 (GAUZE/BANDAGES/DRESSINGS) ×1
COVER BACK TABLE 60X90IN (DRAPES) ×3 IMPLANT
COVER MAYO STAND STRL (DRAPES) ×3 IMPLANT
COVER PROBE W GEL 5X96 (DRAPES) ×3 IMPLANT
DERMABOND ADVANCED (GAUZE/BANDAGES/DRESSINGS) ×2
DERMABOND ADVANCED .7 DNX12 (GAUZE/BANDAGES/DRESSINGS) ×1 IMPLANT
DEVICE DUBIN W/COMP PLATE 8390 (MISCELLANEOUS) ×3 IMPLANT
DRAPE LAPAROSCOPIC ABDOMINAL (DRAPES) ×3 IMPLANT
DRAPE UTILITY XL STRL (DRAPES) ×3 IMPLANT
ELECT BLADE 4.0 EZ CLEAN MEGAD (MISCELLANEOUS) ×3
ELECT COATED BLADE 2.86 ST (ELECTRODE) ×3 IMPLANT
ELECT REM PT RETURN 9FT ADLT (ELECTROSURGICAL) ×3
ELECTRODE BLDE 4.0 EZ CLN MEGD (MISCELLANEOUS) ×1 IMPLANT
ELECTRODE REM PT RTRN 9FT ADLT (ELECTROSURGICAL) ×1 IMPLANT
GLOVE BIO SURGEON STRL SZ7 (GLOVE) ×6 IMPLANT
GLOVE BIOGEL PI IND STRL 7.5 (GLOVE) ×1 IMPLANT
GLOVE BIOGEL PI INDICATOR 7.5 (GLOVE) ×2
GOWN STRL REUS W/ TWL LRG LVL3 (GOWN DISPOSABLE) ×2 IMPLANT
GOWN STRL REUS W/TWL LRG LVL3 (GOWN DISPOSABLE) ×4
ILLUMINATOR WAVEGUIDE N/F (MISCELLANEOUS) ×3 IMPLANT
KIT MARKER MARGIN INK (KITS) ×3 IMPLANT
NEEDLE HYPO 25X1 1.5 SAFETY (NEEDLE) ×3 IMPLANT
PACK BASIN DAY SURGERY FS (CUSTOM PROCEDURE TRAY) ×3 IMPLANT
PENCIL BUTTON HOLSTER BLD 10FT (ELECTRODE) ×3 IMPLANT
SLEEVE SCD COMPRESS KNEE MED (MISCELLANEOUS) ×3 IMPLANT
SPONGE LAP 4X18 X RAY DECT (DISPOSABLE) ×3 IMPLANT
STRIP CLOSURE SKIN 1/2X4 (GAUZE/BANDAGES/DRESSINGS) ×2 IMPLANT
SUT MON AB 5-0 PS2 18 (SUTURE) ×3 IMPLANT
SUT VIC AB 2-0 SH 27 (SUTURE) ×2
SUT VIC AB 2-0 SH 27XBRD (SUTURE) ×1 IMPLANT
SUT VIC AB 3-0 SH 27 (SUTURE) ×2
SUT VIC AB 3-0 SH 27X BRD (SUTURE) ×1 IMPLANT
SYR CONTROL 10ML LL (SYRINGE) ×3 IMPLANT
TOWEL OR 17X24 6PK STRL BLUE (TOWEL DISPOSABLE) ×3 IMPLANT
TOWEL OR NON WOVEN STRL DISP B (DISPOSABLE) ×3 IMPLANT
TUBE CONNECTING 20'X1/4 (TUBING)
TUBE CONNECTING 20X1/4 (TUBING) IMPLANT

## 2016-03-31 NOTE — Op Note (Signed)
Preoperative diagnoses: left breast mammographic mass with core c/w csl Postoperative diagnosis: Same as above Procedure:Leftbreast seed guided excisional biopsy Surgeon: Dr. Serita Grammes Anesthesia: Gen. Estimated blood loss: minimal Complications: None Drains: None Specimens:Leftbreast tissue with paint Sponge and needle count correct at completion Disposition to recovery stable  Indications: This is a 56 yof with left breast mass on mm that underwent core biopsy and is csl. We discussed excisional biopsy using seed guidance. She had seed placed prior to beginning and the mammogramswere in the room.   Procedure: After informed consent was obtained she was then taken to the operating room. She was given cefazolin. Sequential compression devices were on her legs. She was placed under general anesthesia without complication. Her left breast was then prepped and draped in the standard sterile surgical fashion. A surgical timeout was then performed.  I located the radioactive seed with the neoprobe. I infiltrated marcaine in the area of the seed. I made a periareolar incision to hide the scar. I then used the neoprobe to guide the excision of the seed and surrounding tissue.This was confirmed by the neoprobe. This was then taken for mammogram which confirmed removal of the seed and the clip. This was confirmed by radiology. This was then sent to pathology. Hemostasis was observed.I closed the breast tissue with a 2-0 Vicryl. The dermis was closed with 3-0 Vicryl and the skin with 5-0 Monocryl.Dermabond and steristrips were placed on the incision. A breast binder was placed. She was transferred to recovery stable

## 2016-03-31 NOTE — Transfer of Care (Signed)
Immediate Anesthesia Transfer of Care Note  Patient: Summer Brady  Procedure(s) Performed: Procedure(s): RADIOACTIVE SEED GUIDED EXCISIONAL LEFT BREAST BIOPSY (Left)  Patient Location: PACU  Anesthesia Type:General  Level of Consciousness: sedated  Airway & Oxygen Therapy: Patient Spontanous Breathing and Patient connected to face mask oxygen  Post-op Assessment: Report given to RN and Post -op Vital signs reviewed and stable  Post vital signs: Reviewed and stable  Last Vitals:  Vitals:   03/31/16 1020  BP: (!) 146/82  Pulse: (!) 104  Resp: 18  Temp: 36.9 C    Last Pain:  Vitals:   03/31/16 1020  TempSrc: Oral      Patients Stated Pain Goal: 0 (16/94/50 3888)  Complications: No apparent anesthesia complications

## 2016-03-31 NOTE — H&P (Signed)
45 yof referred by Dr Aloha Gell for left breast mass. she has no personal history breast disease. she has family history in an elderly maternal aunt only. she had a screening mm that shows c density breasts. this showed a persistent distortion in the left upper central breast that on US shows a 1.2x1x1.1 cm mass. axillary [place] negative. this underwent core biopsy and is a csl. she is here to discuss options. she notes no mass or dc.   Past Surgical History [name], [place]; 03/08/2016 10:51 AM) Breast Biopsy  Left. Hysterectomy (not due to cancer) - Partial  Spinal Surgery - Lower Back   Diagnostic Studies History [name], [place]; 03/08/2016 10:51 AM) Colonoscopy  1-5 years ago Mammogram  within last year Pap Smear  1-5 years ago  Allergies [name], RMA; 03/08/2016 10:52 AM) Ciprofloxacin *CHEMICALS*  Rash. Latex Exam Gloves *MEDICAL DEVICES AND SUPPLIES*  Rash. Penicillins  Rash.  Medication History [name], [place]; 03/08/2016 10:54 AM) Atorvastatin Calcium (10MG  Tablet, Oral) Active. Montelukast Sodium (10MG  Tablet, Oral) Active. Vitamin C (1000MG  Tablet, Oral) Active. Fexofenadine HCl (Oral) Specific strength unknown - Active. Omega-3 Fatty Acids (1000MG  Capsule, Oral) Active. Omeprazole (40MG  Capsule DR, Oral) Active. Medications Reconciled  Social History [name], [place]; 03/08/2016 10:51 AM) Caffeine use  Carbonated beverages. No alcohol use  No drug use  Tobacco use  Never smoker.  Family History [name], [place]; 03/08/2016 10:51 AM) Diabetes Mellitus  Father. Hypertension  Brother, Father. Prostate Cancer  Father.  Pregnancy / Birth History [name], [place]; 03/08/2016 10:51 AM) Age at menarche  9 years. Gravida  0 Para  0  Other Problems [name], [place]; 03/08/2016 10:51 AM) Gastroesophageal Reflux Disease  Hypercholesterolemia  Melanoma     Review of Systems [name] RMA; 03/08/2016 10:51 AM) General Not Present- Appetite Loss, Chills, Fatigue, Fever, Night Sweats, Weight Gain and Weight Loss. Skin Not Present- Change in Wart/Mole, Dryness, Hives, Jaundice, New Lesions, Non-Healing Wounds, Rash and Ulcer. HEENT Not Present- Earache, Hearing Loss, Hoarseness, Nose Bleed, Oral Ulcers, Ringing in the Ears, Seasonal Allergies, Sinus Pain, Sore Throat, Visual Disturbances, Wears glasses/contact lenses and Yellow Eyes. Respiratory Not Present- Bloody sputum, Chronic Cough, Difficulty Breathing, Snoring and Wheezing. Breast Present- Breast Mass. Not Present- Breast Pain, Nipple Discharge and Skin Changes. Cardiovascular Not Present- Chest Pain, Difficulty Breathing Lying Down, Leg Cramps, Palpitations, Rapid Heart Rate, Shortness of Breath and Swelling of Extremities. Gastrointestinal Not Present- Abdominal Pain, Bloating, Bloody Stool, Change in Bowel Habits, Chronic diarrhea, Constipation, Difficulty Swallowing, Excessive gas, Gets full quickly at meals, Hemorrhoids, Indigestion, Nausea, Rectal Pain and Vomiting. Female Genitourinary Not Present- Frequency, Nocturia, Painful Urination, Pelvic Pain and Urgency. Musculoskeletal Not Present- Back Pain, Joint Pain, Joint Stiffness, Muscle Pain, Muscle Weakness and Swelling of Extremities. Neurological Not Present- Decreased Memory, Fainting, Headaches, Numbness, Seizures, Tingling, Tremor, Trouble walking and Weakness. Psychiatric Not Present- Anxiety, Bipolar, Change in Sleep Pattern, Depression, Fearful and Frequent crying. Endocrine Not Present- Cold Intolerance, Excessive Hunger, Hair Changes, Heat Intolerance, Hot flashes and New Diabetes. Hematology Not Present- Blood Thinners, Easy Bruising, Excessive bleeding, Gland problems, HIV and Persistent Infections.  Vitals [name] RMA; 03/08/2016 10:54 AM) 03/08/2016 10:54 AM Weight: 173.4 lb Height: 67in Body Surface Area: 1.9 m Body Mass Index:  27.16 kg/m  Temp.: 98.2F  Pulse: 108 (Regular)  BP: 128/78 (Sitting, Left Arm, Standard)       Physical Exam [name] MD; 03/08/2016 1:22 PM) General Mental Status-Alert. Orientation-Oriented X3.  Chest  and Lung Exam Chest and lung exam reveals -on auscultation, normal breath sounds, no adventitious sounds and normal vocal resonance.  Breast Nipples-No Discharge. Breast Lump-No Palpable Breast Mass.  Cardiovascular Cardiovascular examination reveals -normal heart sounds, regular rate and rhythm with no murmurs.  Abdomen Note: softnt   Lymphatic Head & Neck  General Head & Neck Lymphatics: Bilateral - Description - Normal. Axillary  General Axillary Region: Bilateral - Description - Normal. Note: no Shonto adenopathy     Assessment & Plan [name] MD; 03/08/2016 1:23 PM) RADIAL SCAR OF BREAST (N64.89) Story: Left breast seed guided excisional biopsy we discussed mm/us finding as well as core biopsy . we discussed option of six month follow up vs excision. we discussed 10-15% upgrade rate on excision path. we have elected to proceed with seed guided excisional biopsy. discussed procedure, risks and recovery

## 2016-03-31 NOTE — Interval H&P Note (Signed)
History and Physical Interval Note:  03/31/2016 12:13 PM  Summer Brady  has presented today for surgery, with the diagnosis of LEFT BREAST MASS  The various methods of treatment have been discussed with the patient and family. After consideration of risks, benefits and other options for treatment, the patient has consented to  Procedure(s): RADIOACTIVE SEED GUIDED EXCISIONAL LEFT BREAST BIOPSY (Left) as a surgical intervention .  The patient's history has been reviewed, patient examined, no change in status, stable for surgery.  I have reviewed the patient's chart and labs.  Questions were answered to the patient's satisfaction.     Cathy Crounse

## 2016-03-31 NOTE — Anesthesia Preprocedure Evaluation (Signed)
Anesthesia Evaluation  Patient identified by MRN, date of birth, ID band Patient awake    Reviewed: Allergy & Precautions, NPO status , Patient's Chart, lab work & pertinent test results  History of Anesthesia Complications Negative for: history of anesthetic complications  Airway Mallampati: II  TM Distance: >3 FB Neck ROM: Full    Dental no notable dental hx. (+) Dental Advisory Given   Pulmonary neg pulmonary ROS,    Pulmonary exam normal        Cardiovascular negative cardio ROS Normal cardiovascular exam     Neuro/Psych negative neurological ROS  negative psych ROS   GI/Hepatic Neg liver ROS, GERD  ,  Endo/Other  negative endocrine ROS  Renal/GU negative Renal ROS     Musculoskeletal   Abdominal   Peds  Hematology   Anesthesia Other Findings   Reproductive/Obstetrics                             Anesthesia Physical Anesthesia Plan  ASA: II  Anesthesia Plan: General   Post-op Pain Management:    Induction: Intravenous  Airway Management Planned: LMA  Additional Equipment:   Intra-op Plan:   Post-operative Plan: Extubation in OR  Informed Consent: I have reviewed the patients History and Physical, chart, labs and discussed the procedure including the risks, benefits and alternatives for the proposed anesthesia with the patient or authorized representative who has indicated his/her understanding and acceptance.   Dental advisory given  Plan Discussed with: CRNA and Anesthesiologist  Anesthesia Plan Comments:         Anesthesia Quick Evaluation

## 2016-03-31 NOTE — Anesthesia Postprocedure Evaluation (Signed)
Anesthesia Post Note  Patient: Summer Brady  Procedure(s) Performed: Procedure(s) (LRB): RADIOACTIVE SEED GUIDED EXCISIONAL LEFT BREAST BIOPSY (Left)  Patient location during evaluation: PACU Anesthesia Type: General Level of consciousness: awake and alert Pain management: pain level controlled Vital Signs Assessment: post-procedure vital signs reviewed and stable Respiratory status: spontaneous breathing, nonlabored ventilation and respiratory function stable Cardiovascular status: blood pressure returned to baseline and stable Postop Assessment: no signs of nausea or vomiting Anesthetic complications: no       Last Vitals:  Vitals:   03/31/16 1415 03/31/16 1428  BP: 120/77 125/74  Pulse: 87 92  Resp: 17 18  Temp:  36.7 C    Last Pain:  Vitals:   03/31/16 1428  TempSrc: Oral  PainSc: 1                  Lynda Rainwater

## 2016-03-31 NOTE — Anesthesia Procedure Notes (Signed)
Procedure Name: LMA Insertion Date/Time: 03/31/2016 12:38 PM Performed by: Maryella Shivers Pre-anesthesia Checklist: Patient identified, Emergency Drugs available, Suction available and Patient being monitored Patient Re-evaluated:Patient Re-evaluated prior to inductionOxygen Delivery Method: Circle system utilized Preoxygenation: Pre-oxygenation with 100% oxygen Intubation Type: IV induction Ventilation: Mask ventilation without difficulty LMA: LMA inserted LMA Size: 4.0 Number of attempts: 1 Placement Confirmation: positive ETCO2 Tube secured with: Tape Dental Injury: Teeth and Oropharynx as per pre-operative assessment

## 2016-03-31 NOTE — Progress Notes (Signed)
Patient ID: Summer Brady, female   DOB: September 16, 1965, 52 y.o.   MRN: [id number] Reviewed nccrs database without any recent narcotic rx, gave percocet postop

## 2016-03-31 NOTE — Discharge Instructions (Signed)
[facility] °Office Phone Number [phone] ° °POST OP INSTRUCTIONS ° °Always review your discharge instruction sheet given to you by the facility where your surgery was performed. ° °IF YOU HAVE DISABILITY OR FAMILY LEAVE FORMS, YOU MUST BRING THEM TO THE OFFICE FOR PROCESSING.  DO NOT GIVE THEM TO YOUR DOCTOR. ° °1. A prescription for pain medication may be given to you upon discharge.  Take your pain medication as prescribed, if needed.  If narcotic pain medicine is not needed, then you may take acetaminophen (Tylenol), naprosyn (Alleve) or ibuprofen (Advil) as needed. °2. Take your usually prescribed medications unless otherwise directed °3. If you need a refill on your pain medication, please contact your pharmacy.  They will contact our office to request authorization.  Prescriptions will not be filled after 5pm or on week-ends. °4. You should eat very light the first 24 hours after surgery, such as soup, crackers, pudding, etc.  Resume your normal diet the day after surgery. °5. Most patients will experience some swelling and bruising in the breast.  Ice packs and a good support bra will help.  Wear the breast binder provided or a sports bra for 72 hours day and night.  After that wear a sports bra during the day until you return to the office. Swelling and bruising can take several days to resolve.  °6. It is common to experience some constipation if taking pain medication after surgery.  Increasing fluid intake and taking a stool softener will usually help or prevent this problem from occurring.  A mild laxative (Milk of Magnesia or Miralax) should be taken according to package directions if there are no bowel movements after 48 hours. °7. Unless discharge instructions indicate otherwise, you may remove your bandages 48 hours after surgery and you may shower at that time.  You may have steri-strips (small skin tapes) in place directly over the incision.  These strips should be left on the  skin for 7-10 days and will come off on their own.  If your surgeon used skin glue on the incision, you may shower in 24 hours.  The glue will flake off over the next 2-3 weeks.  Any sutures or staples will be removed at the office during your follow-up visit. °8. ACTIVITIES:  You may resume regular daily activities (gradually increasing) beginning the next day.  Wearing a good support bra or sports bra minimizes pain and swelling.  You may have sexual intercourse when it is comfortable. °a. You may drive when you no longer are taking prescription pain medication, you can comfortably wear a seatbelt, and you can safely maneuver your car and apply brakes. °b. RETURN TO WORK:  ______________________________________________________________________________________ °9. You should see your doctor in the office for a follow-up appointment approximately two weeks after your surgery.  Your doctor’s nurse will typically make your follow-up appointment when she calls you with your pathology report.  Expect your pathology report 3-4 business days after your surgery.  You may call to check if you do not hear from us after three days. °10. OTHER INSTRUCTIONS: _______________________________________________________________________________________________ _____________________________________________________________________________________________________________________________________ °_____________________________________________________________________________________________________________________________________ °_____________________________________________________________________________________________________________________________________ ° °WHEN TO CALL DR WAKEFIELD: °1. Fever over 101.0 °2. Nausea and/or vomiting. °3. Extreme swelling or bruising. °4. Continued bleeding from incision. °5. Increased pain, redness, or drainage from the incision. ° °The clinic staff is available to answer your questions during regular  business hours.  Please don’t hesitate to call and ask to speak to one of the nurses for clinical concerns.  If   you have a medical emergency, go to the nearest emergency room or call 911.  A surgeon from [facility] is always on call at the hospital. ° °For further questions, please visit [email or web address] ° ° ° ° ° °Post Anesthesia Home Care Instructions ° °Activity: °Get plenty of rest for the remainder of the day. A responsible individual must stay with you for 24 hours following the procedure.  °For the next 24 hours, DO NOT: °-Drive a car °-Operate machinery °-Drink alcoholic beverages °-Take any medication unless instructed by your physician °-Make any legal decisions or sign important papers. ° °Meals: °Start with liquid foods such as gelatin or soup. Progress to regular foods as tolerated. Avoid greasy, spicy, heavy foods. If nausea and/or vomiting occur, drink only clear liquids until the nausea and/or vomiting subsides. Call your physician if vomiting continues. ° °Special Instructions/Symptoms: °Your throat may feel dry or sore from the anesthesia or the breathing tube placed in your throat during surgery. If this causes discomfort, gargle with warm salt water. The discomfort should disappear within 24 hours. ° °If you had a scopolamine patch placed behind your ear for the management of post- operative nausea and/or vomiting: ° °1. The medication in the patch is effective for 72 hours, after which it should be removed.  Wrap patch in a tissue and discard in the trash. Wash hands thoroughly with soap and water. °2. You may remove the patch earlier than 72 hours if you experience unpleasant side effects which may include dry mouth, dizziness or visual disturbances. °3. Avoid touching the patch. Wash your hands with soap and water after contact with the patch. °  ° °

## 2016-04-01 ENCOUNTER — Encounter (HOSPITAL_BASED_OUTPATIENT_CLINIC_OR_DEPARTMENT_OTHER): Payer: Self-pay | Admitting: General Surgery

## 2016-05-23 ENCOUNTER — Other Ambulatory Visit: Payer: Self-pay | Admitting: Obstetrics

## 2016-05-23 DIAGNOSIS — E2839 Other primary ovarian failure: Secondary | ICD-10-CM

## 2016-08-15 ENCOUNTER — Ambulatory Visit
Admission: RE | Admit: 2016-08-15 | Discharge: 2016-08-15 | Disposition: A | Discharge status: Home / Self Care | Payer: 59 | Source: Ambulatory Visit | Attending: Obstetrics | Admitting: Obstetrics

## 2016-08-15 DIAGNOSIS — E2839 Other primary ovarian failure: Secondary | ICD-10-CM

## 2017-01-23 ENCOUNTER — Other Ambulatory Visit: Payer: Self-pay | Admitting: Obstetrics

## 2017-01-23 DIAGNOSIS — Z1231 Encounter for screening mammogram for malignant neoplasm of breast: Secondary | ICD-10-CM

## 2017-02-14 ENCOUNTER — Ambulatory Visit: Payer: 59

## 2017-03-01 ENCOUNTER — Ambulatory Visit: Payer: 59

## 2017-03-21 ENCOUNTER — Ambulatory Visit
Admission: RE | Admit: 2017-03-21 | Discharge: 2017-03-21 | Disposition: A | Discharge status: Home / Self Care | Payer: 59 | Source: Ambulatory Visit | Attending: Obstetrics | Admitting: Obstetrics

## 2017-03-21 DIAGNOSIS — Z1231 Encounter for screening mammogram for malignant neoplasm of breast: Secondary | ICD-10-CM

## 2017-03-22 ENCOUNTER — Other Ambulatory Visit: Payer: Self-pay | Admitting: Obstetrics

## 2017-03-22 DIAGNOSIS — R928 Other abnormal and inconclusive findings on diagnostic imaging of breast: Secondary | ICD-10-CM

## 2017-03-27 ENCOUNTER — Ambulatory Visit
Admission: RE | Admit: 2017-03-27 | Discharge: 2017-03-27 | Disposition: A | Discharge status: Home / Self Care | Payer: 59 | Source: Ambulatory Visit | Attending: Obstetrics | Admitting: Obstetrics

## 2017-03-27 DIAGNOSIS — R928 Other abnormal and inconclusive findings on diagnostic imaging of breast: Secondary | ICD-10-CM

## 2017-09-21 ENCOUNTER — Ambulatory Visit: Payer: 59 | Admitting: Gastroenterology

## 2018-02-12 ENCOUNTER — Other Ambulatory Visit: Payer: Self-pay | Admitting: Obstetrics and Gynecology

## 2018-02-12 DIAGNOSIS — Z1231 Encounter for screening mammogram for malignant neoplasm of breast: Secondary | ICD-10-CM

## 2018-02-20 DIAGNOSIS — J209 Acute bronchitis, unspecified: Secondary | ICD-10-CM | POA: Diagnosis not present

## 2018-02-20 DIAGNOSIS — R6889 Other general symptoms and signs: Secondary | ICD-10-CM | POA: Diagnosis not present

## 2018-03-26 ENCOUNTER — Ambulatory Visit
Admission: RE | Admit: 2018-03-26 | Discharge: 2018-03-26 | Disposition: A | Discharge status: Home / Self Care | Payer: BLUE CROSS/BLUE SHIELD | Source: Ambulatory Visit | Attending: Obstetrics and Gynecology | Admitting: Obstetrics and Gynecology

## 2018-03-26 ENCOUNTER — Other Ambulatory Visit: Payer: Self-pay

## 2018-03-26 DIAGNOSIS — Z1231 Encounter for screening mammogram for malignant neoplasm of breast: Secondary | ICD-10-CM

## 2018-04-19 DIAGNOSIS — K219 Gastro-esophageal reflux disease without esophagitis: Secondary | ICD-10-CM | POA: Diagnosis not present

## 2018-04-19 DIAGNOSIS — E785 Hyperlipidemia, unspecified: Secondary | ICD-10-CM | POA: Diagnosis not present

## 2018-04-19 DIAGNOSIS — F411 Generalized anxiety disorder: Secondary | ICD-10-CM | POA: Diagnosis not present

## 2018-05-14 DIAGNOSIS — D2261 Melanocytic nevi of right upper limb, including shoulder: Secondary | ICD-10-CM | POA: Diagnosis not present

## 2018-05-14 DIAGNOSIS — D225 Melanocytic nevi of trunk: Secondary | ICD-10-CM | POA: Diagnosis not present

## 2018-05-14 DIAGNOSIS — Z85828 Personal history of other malignant neoplasm of skin: Secondary | ICD-10-CM | POA: Diagnosis not present

## 2018-05-14 DIAGNOSIS — L738 Other specified follicular disorders: Secondary | ICD-10-CM | POA: Diagnosis not present

## 2018-05-29 DIAGNOSIS — Z6827 Body mass index (BMI) 27.0-27.9, adult: Secondary | ICD-10-CM | POA: Diagnosis not present

## 2018-05-29 DIAGNOSIS — Z01419 Encounter for gynecological examination (general) (routine) without abnormal findings: Secondary | ICD-10-CM | POA: Diagnosis not present

## 2018-06-15 ENCOUNTER — Other Ambulatory Visit: Payer: Self-pay | Admitting: Obstetrics and Gynecology

## 2018-06-15 DIAGNOSIS — M8580 Other specified disorders of bone density and structure, unspecified site: Secondary | ICD-10-CM

## 2018-08-17 ENCOUNTER — Other Ambulatory Visit: Payer: BLUE CROSS/BLUE SHIELD

## 2018-09-26 ENCOUNTER — Other Ambulatory Visit: Payer: Self-pay

## 2018-09-26 ENCOUNTER — Ambulatory Visit
Admission: RE | Admit: 2018-09-26 | Discharge: 2018-09-26 | Disposition: A | Discharge status: Home / Self Care | Payer: BC Managed Care – PPO | Source: Ambulatory Visit | Attending: Obstetrics and Gynecology | Admitting: Obstetrics and Gynecology

## 2018-09-26 DIAGNOSIS — M8589 Other specified disorders of bone density and structure, multiple sites: Secondary | ICD-10-CM | POA: Diagnosis not present

## 2018-09-26 DIAGNOSIS — Z780 Asymptomatic menopausal state: Secondary | ICD-10-CM | POA: Diagnosis not present

## 2018-09-26 DIAGNOSIS — M8580 Other specified disorders of bone density and structure, unspecified site: Secondary | ICD-10-CM

## 2018-11-05 DIAGNOSIS — H52223 Regular astigmatism, bilateral: Secondary | ICD-10-CM | POA: Diagnosis not present

## 2018-11-05 DIAGNOSIS — H16223 Keratoconjunctivitis sicca, not specified as Sjogren's, bilateral: Secondary | ICD-10-CM | POA: Diagnosis not present

## 2018-11-05 DIAGNOSIS — H5213 Myopia, bilateral: Secondary | ICD-10-CM | POA: Diagnosis not present

## 2018-11-05 DIAGNOSIS — H26492 Other secondary cataract, left eye: Secondary | ICD-10-CM | POA: Diagnosis not present

## 2018-11-05 DIAGNOSIS — H5200 Hypermetropia, unspecified eye: Secondary | ICD-10-CM | POA: Diagnosis not present

## 2018-11-12 ENCOUNTER — Ambulatory Visit: Payer: Self-pay | Admitting: Family Medicine

## 2018-11-12 DIAGNOSIS — Z961 Presence of intraocular lens: Secondary | ICD-10-CM | POA: Diagnosis not present

## 2018-11-12 DIAGNOSIS — H26491 Other secondary cataract, right eye: Secondary | ICD-10-CM | POA: Diagnosis not present

## 2018-11-20 ENCOUNTER — Encounter: Payer: Self-pay | Admitting: Family Medicine

## 2018-11-20 ENCOUNTER — Other Ambulatory Visit: Payer: Self-pay

## 2018-11-20 ENCOUNTER — Ambulatory Visit: Payer: BC Managed Care – PPO | Admitting: Family Medicine

## 2018-11-20 VITALS — BP 127/75 | HR 95 | Temp 99.0°F | Resp 20 | Ht 67.0 in | Wt 177.0 lb

## 2018-11-20 DIAGNOSIS — F411 Generalized anxiety disorder: Secondary | ICD-10-CM | POA: Insufficient documentation

## 2018-11-20 DIAGNOSIS — F410 Panic disorder [episodic paroxysmal anxiety] without agoraphobia: Secondary | ICD-10-CM | POA: Insufficient documentation

## 2018-11-20 DIAGNOSIS — Z7689 Persons encountering health services in other specified circumstances: Secondary | ICD-10-CM | POA: Diagnosis not present

## 2018-11-20 DIAGNOSIS — Z79899 Other long term (current) drug therapy: Secondary | ICD-10-CM | POA: Diagnosis not present

## 2018-11-20 DIAGNOSIS — E782 Mixed hyperlipidemia: Secondary | ICD-10-CM | POA: Insufficient documentation

## 2018-11-20 DIAGNOSIS — K219 Gastro-esophageal reflux disease without esophagitis: Secondary | ICD-10-CM

## 2018-11-20 MED ORDER — LORAZEPAM 0.5 MG PO TABS: 0.5000 mg | ORAL_TABLET | Freq: Every day | ORAL | 3 refills | Status: DC | PRN

## 2018-11-20 NOTE — Patient Instructions (Signed)
We talked about the risk of Lorazepam. You are using this very sparingly so I think it's appropriate to continue for now.  If you find increased need for it, please let me know so we can talk about a controller medication.  For your reflux, ok to use Omeprazole twice daily if needed.  If you are doing this more days than not, let me know and I will adjust your medication so you don't run out early.   Controlled Substance Guidelines:  1. You cannot get an early refill, even it is lost.  2. You cannot get controlled medications from any other doctor, unless it is the emergency department and related to a new problem or injury.  3. You cannot use alcohol, marijuana, cocaine or any other recreational drugs while using this medication. This is very dangerous.  4. You are willing to have your urine drug tested at each visit.  5. You will not drive while using this medication, because that can put yourself and others in serious danger of an accident. 6. If any medication is stolen, then there must be a police report to verify it, or it cannot be refilled.  7. I will not prescribe these medications for longer than 3 months.  8. You must bring your pill bottle to each visit.  9. You must use the same pharmacy for all refills for the medication, unless you clear it with me beforehand.  10. You cannot share or sell this medication.

## 2018-11-20 NOTE — Progress Notes (Signed)
Subjective: [id number] care, GAD w/ panic HPI: Summer Brady is a 53 y.o. female presenting to clinic today for:  1. Anxiety w/ panic History: Several year history with no previous medications tried.  No family history of mental health disorders.  No previous hospitalizations for mental health disorder  Patient reports very rare use of lorazepam 0.5 mg, less than a few times per month.  Denies any alcohol use.  Never any excessive daytime sleepiness, falls, change in mentation, memory loss.  2. Hyperlipidemia Reports compliance with her statin.  No chest pain, shortness of breath, lower extremity edema.  3.  GERD Patient reports compliance with omeprazole 20 mg daily.  She notes a fairly stable control of her GERD except for if she eats something that has a lot of acid in it like spaghetti or pizza.  At that time she does get some left upper quadrant discomfort and bloating.  She uses Gas-X with some relief but wonders if it would be okay for her to take an additional 20 mg in the evening on those days.  Denies any nausea, vomiting, hematochezia or melena.  She was followed by Dr. Maurene Capes with [facility] for colon Cancer screening.  Her last colonoscopy was good.  Past Medical History:  Diagnosis Date  . Allergy   . GERD (gastroesophageal reflux disease)   . History of kidney stones   . Hyperlipidemia   . Seasonal allergies    Past Surgical History:  Procedure Laterality Date  . BREAST BIOPSY Left 03/2016   benign  . LUMBAR SPINE SURGERY  2009  . MOHS SURGERY  09/2013   nose  . RADIOACTIVE SEED GUIDED EXCISIONAL BREAST BIOPSY Left 03/31/2016   Procedure: RADIOACTIVE SEED GUIDED EXCISIONAL LEFT BREAST BIOPSY;  Surgeon: [name], MD;  Location: [facility];  Service: General;  Laterality: Left;  . TOTAL ABDOMINAL HYSTERECTOMY  1997  . WRIST FRACTURE SURGERY Right 2012   Social History   Socioeconomic History  . Marital status: Married    Spouse name: Not on  file  . Number of children: Not on file  . Years of education: 12th grade  . Highest education level: Not on file  Occupational History  . Occupation: office work    Fish farm manager: [employer]  Social Needs  . Financial resource strain: Not on file  . Food insecurity    Worry: Not on file    Inability: Not on file  . Transportation needs    Medical: Not on file    Non-medical: Not on file  Tobacco Use  . Smoking status: Never Smoker  . Smokeless tobacco: Never Used  Substance and Sexual Activity  . Alcohol use: No  . Drug use: No  . Sexual activity: Yes    Birth control/protection: Surgical  Lifestyle  . Physical activity    Days per week: Not on file    Minutes per session: Not on file  . Stress: Not on file  Relationships  . Social Herbalist on phone: Not on file    Gets together: Not on file    Attends religious service: Not on file    Active member of club or organization: Not on file    Attends meetings of clubs or organizations: Not on file    Relationship status: Not on file  . Intimate partner violence    Fear of current or ex partner: Not on file    Emotionally abused: Not on file  Physically abused: Not on file    Forced sexual activity: Not on file  Other Topics Concern  . Not on file  Social History Narrative  . Not on file   No outpatient medications have been marked as taking for the 11/20/18 encounter (Office Visit) with Janora Norlander, DO.   Family History  Problem Relation Age of Onset  . Heart disease Other   . Cancer Other   . Diabetes Other   . Colon cancer Neg Hx   . Breast cancer Neg Hx    Allergies  Allergen Reactions  . Fluocinolone Rash  . Latex Rash  . Penicillins Rash  . Sulfa Antibiotics Rash  . Ciprofloxacin Rash     Health Maintenance: Tdap, pap, flu ROS: Per HPI  Objective: Office vital signs reviewed. BP 127/75   Pulse 95   Temp 99 F (37.2 C)   Resp 20   Ht 5\' 7"  (1.702 m)   Wt 177 lb (80.3 kg)    SpO2 99%   BMI 27.72 kg/m   Physical Examination:  General: Awake, alert, well nourished, No acute distress HEENT: Normal, sclera white, MMM Cardio: regular rate and rhythm, S1S2 heard, no murmurs appreciated Pulm: clear to auscultation bilaterally, no wheezes, rhonchi or rales; normal work of breathing on room air Extremities: warm, well perfused, No edema, cyanosis or clubbing; +2 pulses bilaterally MSK: normal gait and station Skin: dry; intact; no rashes or lesions Neuro: AOx3 Psych: Mood stable, speech normal, affect appropriate, pleasant and interactive Depression screen [facility] 2/9 11/20/2018  Decreased Interest 3  Down, Depressed, Hopeless 0  PHQ - 2 Score 3  Altered sleeping 0  Tired, decreased energy 0  Change in appetite 0  Feeling bad or failure about yourself  0  Trouble concentrating 0  Moving slowly or fidgety/restless 0  Suicidal thoughts 0  PHQ-9 Score 3  Difficult doing work/chores Not difficult at all   GAD 7 : Generalized Anxiety Score 11/20/2018  Nervous, Anxious, on Edge 0  Control/stop worrying 0  Worry too much - different things 0  Trouble relaxing 0  Restless 0  Easily annoyed or irritable 0  Afraid - awful might happen 0  Total GAD 7 Score 0  Anxiety Difficulty Not difficult at all   Assessment/ Plan: 53 y.o. female   1. Generalized anxiety disorder with panic attacks Sparing use of the benzodiazepine.  We discussed that as she ages we may need to consider alternative therapies.  She was amenable to this.  Additionally, we discussed the addictive nature of the medication and increased risk for dementia.  I think at this point she is using it so sparingly that a daily medication is not needed.  However we did discuss if she has an increased need for the benzodiazepine we should consider transition over to an SSRI.  She was in agreement with this plan.  Controlled substance contract was reviewed and signed.  A urine drug screen was obtained per  policy.  The Narcotic Database has been reviewed.  There were no red flags.  She will follow-up in 6 months, sooner if needed  - DRUG SCREEN-TOXASSURE  2. Establishing care with new doctor, encounter for  3. Controlled substance agreement signed - DRUG SCREEN-TOXASSURE  4. Mixed hyperlipidemia Continue statin and fish oils.  Not due for lipid panel.  We will defer additional labs to next lab draw.  5. Gastroesophageal reflux disease without esophagitis Stable with PPI with intermittent flares.  Discussed that she could  take an extra 20 mg in the evening if needed.  If she finds that she is needing this more often would recommend contacting the office for further instruction as we would need to rewrite her prescription and possibly have her rechecked by her GI provider.   Janora Norlander, DO [place] [phone]

## 2018-11-22 LAB — TOXASSURE SELECT 13 (MW), URINE

## 2018-12-21 DIAGNOSIS — D485 Neoplasm of uncertain behavior of skin: Secondary | ICD-10-CM | POA: Diagnosis not present

## 2018-12-21 DIAGNOSIS — D224 Melanocytic nevi of scalp and neck: Secondary | ICD-10-CM | POA: Diagnosis not present

## 2019-01-15 ENCOUNTER — Encounter: Payer: Self-pay | Admitting: Family Medicine

## 2019-02-11 ENCOUNTER — Other Ambulatory Visit: Payer: Self-pay | Admitting: Obstetrics and Gynecology

## 2019-02-11 DIAGNOSIS — Z1231 Encounter for screening mammogram for malignant neoplasm of breast: Secondary | ICD-10-CM

## 2019-03-27 ENCOUNTER — Ambulatory Visit
Admission: RE | Admit: 2019-03-27 | Discharge: 2019-03-27 | Disposition: A | Discharge status: Home / Self Care | Payer: BC Managed Care – PPO | Source: Ambulatory Visit | Attending: Obstetrics and Gynecology | Admitting: Obstetrics and Gynecology

## 2019-03-27 ENCOUNTER — Other Ambulatory Visit: Payer: Self-pay

## 2019-03-27 ENCOUNTER — Other Ambulatory Visit: Payer: Self-pay | Admitting: Obstetrics and Gynecology

## 2019-03-27 DIAGNOSIS — R921 Mammographic calcification found on diagnostic imaging of breast: Secondary | ICD-10-CM

## 2019-03-27 DIAGNOSIS — Z1231 Encounter for screening mammogram for malignant neoplasm of breast: Secondary | ICD-10-CM | POA: Diagnosis not present

## 2019-04-02 DIAGNOSIS — D225 Melanocytic nevi of trunk: Secondary | ICD-10-CM | POA: Diagnosis not present

## 2019-04-02 DIAGNOSIS — D2272 Melanocytic nevi of left lower limb, including hip: Secondary | ICD-10-CM | POA: Diagnosis not present

## 2019-04-02 DIAGNOSIS — Z85828 Personal history of other malignant neoplasm of skin: Secondary | ICD-10-CM | POA: Diagnosis not present

## 2019-04-02 DIAGNOSIS — D2239 Melanocytic nevi of other parts of face: Secondary | ICD-10-CM | POA: Diagnosis not present

## 2019-04-12 ENCOUNTER — Ambulatory Visit
Admission: RE | Admit: 2019-04-12 | Discharge: 2019-04-12 | Disposition: A | Discharge status: Home / Self Care | Payer: BC Managed Care – PPO | Source: Ambulatory Visit | Attending: Obstetrics and Gynecology | Admitting: Obstetrics and Gynecology

## 2019-04-12 ENCOUNTER — Other Ambulatory Visit: Payer: Self-pay

## 2019-04-12 ENCOUNTER — Other Ambulatory Visit: Payer: Self-pay | Admitting: Obstetrics and Gynecology

## 2019-04-12 DIAGNOSIS — R922 Inconclusive mammogram: Secondary | ICD-10-CM | POA: Diagnosis not present

## 2019-04-12 DIAGNOSIS — R921 Mammographic calcification found on diagnostic imaging of breast: Secondary | ICD-10-CM

## 2019-04-30 ENCOUNTER — Other Ambulatory Visit: Payer: Self-pay | Admitting: Family Medicine

## 2019-04-30 ENCOUNTER — Telehealth: Payer: Self-pay | Admitting: Family Medicine

## 2019-04-30 DIAGNOSIS — M79675 Pain in left toe(s): Secondary | ICD-10-CM | POA: Diagnosis not present

## 2019-04-30 DIAGNOSIS — F410 Panic disorder [episodic paroxysmal anxiety] without agoraphobia: Secondary | ICD-10-CM

## 2019-04-30 DIAGNOSIS — M7742 Metatarsalgia, left foot: Secondary | ICD-10-CM | POA: Diagnosis not present

## 2019-04-30 DIAGNOSIS — E782 Mixed hyperlipidemia: Secondary | ICD-10-CM

## 2019-04-30 DIAGNOSIS — Z130 Encounter for screening for diseases of the blood and blood-forming organs and certain disorders involving the immune mechanism: Secondary | ICD-10-CM

## 2019-04-30 NOTE — Telephone Encounter (Signed)
Ok to come in fasting at her convenience

## 2019-05-15 ENCOUNTER — Other Ambulatory Visit: Payer: Self-pay

## 2019-05-15 ENCOUNTER — Encounter: Payer: Self-pay | Admitting: Family Medicine

## 2019-05-15 ENCOUNTER — Ambulatory Visit: Payer: BC Managed Care – PPO | Admitting: Family Medicine

## 2019-05-15 VITALS — BP 130/79 | HR 98 | Temp 97.9°F | Wt 173.0 lb

## 2019-05-15 DIAGNOSIS — E782 Mixed hyperlipidemia: Secondary | ICD-10-CM | POA: Diagnosis not present

## 2019-05-15 DIAGNOSIS — F411 Generalized anxiety disorder: Secondary | ICD-10-CM

## 2019-05-15 DIAGNOSIS — F410 Panic disorder [episodic paroxysmal anxiety] without agoraphobia: Secondary | ICD-10-CM | POA: Diagnosis not present

## 2019-05-15 DIAGNOSIS — Z130 Encounter for screening for diseases of the blood and blood-forming organs and certain disorders involving the immune mechanism: Secondary | ICD-10-CM

## 2019-05-15 DIAGNOSIS — K219 Gastro-esophageal reflux disease without esophagitis: Secondary | ICD-10-CM

## 2019-05-15 MED ORDER — FAMOTIDINE 20 MG PO TABS: 20.0000 mg | ORAL_TABLET | Freq: Every day | ORAL | 3 refills | Status: DC

## 2019-05-15 NOTE — Progress Notes (Signed)
Subjective: CC: f/u HLD, GAD w/ panic HPI: Summer Brady is a 54 y.o. female presenting to clinic today for:  1. Anxiety w/ panic History: Several year history with no previous medications tried.  No family history of mental health disorders.  No previous hospitalizations for mental health disorder  Continues to have extremely rare use of the Ativan.  She brings her bottle today which is almost full from our last visit.  She has not even picked up her next refill.  Denies any excessive daytime sleepiness when she takes the medicine, no memory changes, no altered mentation, no dizziness or falls.  She never drinks alcohol with this medicine in fact was never drinks alcohol in general.  2. Hyperlipidemia Reports compliance with her statin.  No refills needed.  She got her fasting labs done this am.  No chest pain, shortness of breath, lower extremity edema.  3.  GERD Patient reports compliance with omeprazole 20 mg daily.  She continues to have some nighttime symptoms that are intermittent where she will have reflux up until her throat and feeling that she is having excessive belching and bloating.  She has not identified specific food that triggers this.  She never started taking omeprazole twice daily.  No hematochezia or melena.  No abdominal pain.  Past Medical History:  Diagnosis Date  . Allergy   . GERD (gastroesophageal reflux disease)   . History of kidney stones   . Hyperlipidemia   . Seasonal allergies    Past Surgical History:  Procedure Laterality Date  . BREAST BIOPSY Left 03/2016   benign  . LUMBAR SPINE SURGERY  2009  . MOHS SURGERY  09/2013   nose  . RADIOACTIVE SEED GUIDED EXCISIONAL BREAST BIOPSY Left 03/31/2016   Procedure: RADIOACTIVE SEED GUIDED EXCISIONAL LEFT BREAST BIOPSY;  Surgeon: [name], MD;  Location: [facility];  Service: General;  Laterality: Left;  . TOTAL ABDOMINAL HYSTERECTOMY  1997  . WRIST FRACTURE SURGERY Right 2012    Social History   Socioeconomic History  . Marital status: Married    Spouse name: Not on file  . Number of children: 0  . Years of education: 12th grade  . Highest education level: Not on file  Occupational History  . Occupation: office work    Fish farm manager: [employer]  Tobacco Use  . Smoking status: Never Smoker  . Smokeless tobacco: Never Used  Substance and Sexual Activity  . Alcohol use: No  . Drug use: No  . Sexual activity: Yes    Birth control/protection: Surgical  Other Topics Concern  . Not on file  Social History Narrative  . Not on file   Social Determinants of Health   Financial Resource Strain:   . Difficulty of Paying Living Expenses:   Food Insecurity:   . Worried About Charity fundraiser in the Last Year:   . Arboriculturist in the Last Year:   Transportation Needs:   . Film/video editor (Medical):   Marland Kitchen Lack of Transportation (Non-Medical):   Physical Activity:   . Days of Exercise per Week:   . Minutes of Exercise per Session:   Stress:   . Feeling of Stress :   Social Connections:   . Frequency of Communication with Friends and Family:   . Frequency of Social Gatherings with Friends and Family:   . Attends Religious Services:   . Active Member of Clubs or Organizations:   . Attends Club or  Organization Meetings:   Marland Kitchen Marital Status:   Intimate Partner Violence:   . Fear of Current or Ex-Partner:   . Emotionally Abused:   Marland Kitchen Physically Abused:   . Sexually Abused:    No outpatient medications have been marked as taking for the 05/15/19 encounter (Appointment) with Janora Norlander, DO.   Family History  Problem Relation Age of Onset  . Hypertension Mother   . Hyperlipidemia Mother   . Heart attack Father   . Hypertension Father   . Cancer Father        prostate   . Diabetes Father   . Hypertension Brother   . Diabetes Maternal Grandmother   . Cancer Maternal Grandfather        lung  . Diabetes Paternal Grandfather   . Breast  cancer Maternal Aunt   . Colon cancer Neg Hx    Allergies  Allergen Reactions  . Fluocinolone Rash  . Latex Rash  . Penicillins Rash  . Sulfa Antibiotics Rash  . Ciprofloxacin Rash     Health Maintenance: UTD ROS: Per HPI  Objective: Office vital signs reviewed. BP 130/79   Pulse 98   Temp 97.9 F (36.6 C)   Wt 173 lb (78.5 kg)   BMI 27.10 kg/m   Physical Examination:  General: Awake, alert, well nourished, No acute distress HEENT: Normal, sclera white, MMM Cardio: regular rate and rhythm, S1S2 heard, no murmurs appreciated Pulm: clear to auscultation bilaterally, no wheezes, rhonchi or rales; normal work of breathing on room air Extremities: warm, well perfused, No edema, cyanosis or clubbing; +2 pulses bilaterally GI: flat, soft, NT, ND, no epigastric TTP Psych: Mood stable, speech normal, affect appropriate, pleasant and interactive Depression screen [facility] 2/9 05/15/2019 11/20/2018  Decreased Interest 0 3  Down, Depressed, Hopeless 0 0  PHQ - 2 Score 0 3  Altered sleeping 0 0  Tired, decreased energy 0 0  Change in appetite 0 0  Feeling bad or failure about yourself  0 0  Trouble concentrating 0 0  Moving slowly or fidgety/restless 0 0  Suicidal thoughts 0 0  PHQ-9 Score 0 3  Difficult doing work/chores - Not difficult at all   GAD 7 : Generalized Anxiety Score 05/15/2019 11/20/2018  Nervous, Anxious, on Edge 0 0  Control/stop worrying 0 0  Worry too much - different things 0 0  Trouble relaxing 0 0  Restless 0 0  Easily annoyed or irritable 0 0  Afraid - awful might happen 0 0  Total GAD 7 Score 0 0  Anxiety Difficulty - Not difficult at all   Assessment/ Plan: 54 y.o. female   1. Generalized anxiety disorder with panic attacks Controlled.  Extremely rare use of the benzodiazepine.  She has had some increased eating recently but denies any excessive stress.  Will check thyroid as below - TSH  2. Mixed hyperlipidemia Check fasting lipid panel.  Continue  statin - TSH - Lipid panel - CMP14+EGFR  3. Screening, anemia, deficiency, iron Screen for anemia - CBC  4. Gastroesophageal reflux disease without esophagitis Nothing on exam to suggest peptic ulcer.  Though I do question the increased appetite ?  Gnawing.  We will add Pepcid nightly.  Continue PPI in the daytime.  Follow-up as needed - famotidine (PEPCID) 20 MG tablet; Take 1 tablet (20 mg total) by mouth at bedtime. For reflux  Dispense: 90 tablet; Refill: [place], [place] [phone]

## 2019-05-16 LAB — CBC
Hematocrit: 41.1 % (ref 34.0–46.6)
Hemoglobin: 14.1 g/dL (ref 11.1–15.9)
MCH: 31.4 pg (ref 26.6–33.0)
MCHC: 34.3 g/dL (ref 31.5–35.7)
MCV: 92 fL (ref 79–97)
Platelets: 199 10*3/uL (ref 150–450)
RBC: 4.49 x10E6/uL (ref 3.77–5.28)
RDW: 13 % (ref 11.7–15.4)
WBC: 7 10*3/uL (ref 3.4–10.8)

## 2019-05-16 LAB — CMP14+EGFR
ALT: 30 IU/L (ref 0–32)
AST: 24 IU/L (ref 0–40)
Albumin/Globulin Ratio: 2.6 — ABNORMAL HIGH (ref 1.2–2.2)
Albumin: 4.6 g/dL (ref 3.8–4.9)
Alkaline Phosphatase: 109 IU/L (ref 39–117)
BUN/Creatinine Ratio: 11 (ref 9–23)
BUN: 7 mg/dL (ref 6–24)
Bilirubin Total: 1.1 mg/dL (ref 0.0–1.2)
CO2: 23 mmol/L (ref 20–29)
Calcium: 9.2 mg/dL (ref 8.7–10.2)
Chloride: 104 mmol/L (ref 96–106)
Creatinine, Ser: 0.65 mg/dL (ref 0.57–1.00)
GFR calc Af Amer: 115 mL/min/{1.73_m2} (ref >59)
GFR calc non Af Amer: 100 mL/min/{1.73_m2} (ref >59)
Globulin, Total: 1.8 g/dL (ref 1.5–4.5)
Glucose: 129 mg/dL — ABNORMAL HIGH (ref 65–99)
Potassium: 3.6 mmol/L (ref 3.5–5.2)
Sodium: 145 mmol/L — ABNORMAL HIGH (ref 134–144)
Total Protein: 6.4 g/dL (ref 6.0–8.5)

## 2019-05-16 LAB — TSH: TSH: 1.79 u[IU]/mL (ref 0.450–4.500)

## 2019-05-16 LAB — LIPID PANEL
Chol/HDL Ratio: 3.4 ratio (ref 0.0–4.4)
Cholesterol, Total: 148 mg/dL (ref 100–199)
HDL: 43 mg/dL (ref >39)
LDL Chol Calc (NIH): 74 mg/dL (ref 0–99)
Triglycerides: 181 mg/dL — ABNORMAL HIGH (ref 0–149)
VLDL Cholesterol Cal: 31 mg/dL (ref 5–40)

## 2019-05-20 ENCOUNTER — Ambulatory Visit: Payer: BC Managed Care – PPO | Admitting: Family Medicine

## 2019-06-26 ENCOUNTER — Ambulatory Visit: Payer: BC Managed Care – PPO | Admitting: Family Medicine

## 2019-06-26 DIAGNOSIS — Z6826 Body mass index (BMI) 26.0-26.9, adult: Secondary | ICD-10-CM | POA: Diagnosis not present

## 2019-06-26 DIAGNOSIS — Z01419 Encounter for gynecological examination (general) (routine) without abnormal findings: Secondary | ICD-10-CM | POA: Diagnosis not present

## 2019-06-28 ENCOUNTER — Encounter: Payer: Self-pay | Admitting: Family Medicine

## 2019-06-28 MED ORDER — OMEPRAZOLE 20 MG PO CPDR: 20.0000 mg | DELAYED_RELEASE_CAPSULE | Freq: Every day | ORAL | 3 refills | Status: DC

## 2019-06-28 MED ORDER — ATORVASTATIN CALCIUM 10 MG PO TABS: 10.0000 mg | ORAL_TABLET | Freq: Every day | ORAL | 3 refills | Status: DC

## 2019-06-28 MED ORDER — MONTELUKAST SODIUM 10 MG PO TABS: 10.0000 mg | ORAL_TABLET | Freq: Every day | ORAL | 3 refills | Status: DC

## 2019-10-15 ENCOUNTER — Other Ambulatory Visit: Payer: Self-pay

## 2019-10-15 ENCOUNTER — Ambulatory Visit
Admission: RE | Admit: 2019-10-15 | Discharge: 2019-10-15 | Disposition: A | Discharge status: Home / Self Care | Payer: BC Managed Care – PPO | Source: Ambulatory Visit | Attending: Obstetrics and Gynecology | Admitting: Obstetrics and Gynecology

## 2019-10-15 ENCOUNTER — Other Ambulatory Visit: Payer: Self-pay | Admitting: Obstetrics and Gynecology

## 2019-10-15 DIAGNOSIS — R922 Inconclusive mammogram: Secondary | ICD-10-CM | POA: Diagnosis not present

## 2019-10-15 DIAGNOSIS — R921 Mammographic calcification found on diagnostic imaging of breast: Secondary | ICD-10-CM

## 2019-11-15 ENCOUNTER — Ambulatory Visit: Payer: BC Managed Care – PPO | Admitting: Family Medicine

## 2019-11-15 ENCOUNTER — Other Ambulatory Visit: Payer: Self-pay

## 2019-11-15 VITALS — BP 134/86 | HR 75 | Temp 97.7°F | Wt 176.0 lb

## 2019-11-15 DIAGNOSIS — F410 Panic disorder [episodic paroxysmal anxiety] without agoraphobia: Secondary | ICD-10-CM

## 2019-11-15 DIAGNOSIS — E781 Pure hyperglyceridemia: Secondary | ICD-10-CM

## 2019-11-15 DIAGNOSIS — F411 Generalized anxiety disorder: Secondary | ICD-10-CM | POA: Diagnosis not present

## 2019-11-15 DIAGNOSIS — R739 Hyperglycemia, unspecified: Secondary | ICD-10-CM

## 2019-11-15 LAB — BAYER DCA HB A1C WAIVED: HB A1C (BAYER DCA - WAIVED): 5.4 % (ref <7.0)

## 2019-11-15 NOTE — Progress Notes (Signed)
Subjective: CC: f/u GAD PCP: Janora Norlander, DO VHQ:IONG B Wamser is a 54 y.o. female presenting to clinic today for:  1. GAD Anxiety remains stable.  Uses Lorazepam extremely sparingly.  Still has several tablets of the refill from April.  No refills needed today.  No excessive daytime sedation or falls  2.  Hyperlipidemia Patient is compliant with Lipitor 10 mg daily.  She is fasting today.  No chest pain, shortness of breath  3. GERD Compliant with Omeprazole and using Pepcid several days per week for breakthrough acid reflux.  No hematochezia melena, nausea or vomiting    ROS: Per HPI  Allergies  Allergen Reactions  . Fluocinolone Rash  . Latex Rash  . Penicillins Rash  . Sulfa Antibiotics Rash  . Ciprofloxacin Rash   Past Medical History:  Diagnosis Date  . Allergy   . GERD (gastroesophageal reflux disease)   . History of kidney stones   . Hyperlipidemia   . Seasonal allergies     Current Outpatient Medications:  .  Ascorbic Acid (VITAMIN C) 1000 MG tablet, Take 1,000 mg by mouth daily., Disp: , Rfl:  .  atorvastatin (LIPITOR) 10 MG tablet, Take 1 tablet (10 mg total) by mouth daily., Disp: 90 tablet, Rfl: 3 .  famotidine (PEPCID) 20 MG tablet, Take 1 tablet (20 mg total) by mouth at bedtime. For reflux, Disp: 90 tablet, Rfl: 3 .  Fexofenadine HCl (ALLEGRA PO), Take by mouth.  , Disp: , Rfl:  .  LORazepam (ATIVAN) 0.5 MG tablet, Take 1 tablet (0.5 mg total) by mouth daily as needed for anxiety., Disp: 30 tablet, Rfl: 3 .  montelukast (SINGULAIR) 10 MG tablet, Take 1 tablet (10 mg total) by mouth daily., Disp: 90 tablet, Rfl: 3 .  Omega-3 Fatty Acids (FISH OIL) 1000 MG CAPS, Take by mouth daily., Disp: , Rfl:  .  omeprazole (PRILOSEC) 20 MG capsule, Take 1 capsule (20 mg total) by mouth daily., Disp: 90 capsule, Rfl: 3 .  OVER THE COUNTER MEDICATION, echinacea and elderberry, Disp: , Rfl:  .  vitamin E 400 UNIT capsule, Take 400 Units by mouth daily., Disp: ,  Rfl:  Social History   Socioeconomic History  . Marital status: Married    Spouse name: Not on file  . Number of children: 0  . Years of education: 12th grade  . Highest education level: Not on file  Occupational History  . Occupation: office work    Fish farm manager: [employer]  Tobacco Use  . Smoking status: Never Smoker  . Smokeless tobacco: Never Used  Vaping Use  . Vaping Use: Never used  Substance and Sexual Activity  . Alcohol use: No  . Drug use: No  . Sexual activity: Yes    Birth control/protection: Surgical  Other Topics Concern  . Not on file  Social History Narrative  . Not on file   Social Determinants of Health   Financial Resource Strain:   . Difficulty of Paying Living Expenses: Not on file  Food Insecurity:   . Worried About Charity fundraiser in the Last Year: Not on file  . Ran Out of Food in the Last Year: Not on file  Transportation Needs:   . Lack of Transportation (Medical): Not on file  . Lack of Transportation (Non-Medical): Not on file  Physical Activity:   . Days of Exercise per Week: Not on file  . Minutes of Exercise per Session: Not on file  Stress:   .  Feeling of Stress : Not on file  Social Connections:   . Frequency of Communication with Friends and Family: Not on file  . Frequency of Social Gatherings with Friends and Family: Not on file  . Attends Religious Services: Not on file  . Active Member of Clubs or Organizations: Not on file  . Attends Archivist Meetings: Not on file  . Marital Status: Not on file  Intimate Partner Violence:   . Fear of Current or Ex-Partner: Not on file  . Emotionally Abused: Not on file  . Physically Abused: Not on file  . Sexually Abused: Not on file   Family History  Problem Relation Age of Onset  . Hypertension Mother   . Hyperlipidemia Mother   . Heart attack Father   . Hypertension Father   . Cancer Father        prostate   . Diabetes Father   . Hypertension Brother   .  Diabetes Maternal Grandmother   . Cancer Maternal Grandfather        lung  . Diabetes Paternal Grandfather   . Breast cancer Maternal Aunt   . Colon cancer Neg Hx     Objective: Office vital signs reviewed. BP 134/86   Pulse 75   Temp 97.7 F (36.5 C)   Wt 176 lb (79.8 kg)   SpO2 98%   BMI 27.57 kg/m   Physical Examination:  General: Awake, alert, well nourished, No acute distress HEENT: Normal, sclera white, MMM Cardio: regular rate and rhythm, S1S2 heard, no murmurs appreciated Pulm: clear to auscultation bilaterally, no wheezes, rhonchi or rales; normal work of breathing on room air GI: soft, non-tender, non-distended, bowel sounds present x4, no hepatomegaly, no splenomegaly, no masses Extremities: warm, well perfused, No edema, cyanosis or clubbing; +2 pulses bilaterally Psych: Mood stable, speech normal Depression screen [facility] 2/9 11/15/2019 05/15/2019 11/20/2018  Decreased Interest 0 0 3  Down, Depressed, Hopeless 0 0 0  PHQ - 2 Score 0 0 3  Altered sleeping 0 0 0  Tired, decreased energy 0 0 0  Change in appetite 0 0 0  Feeling bad or failure about yourself  0 0 0  Trouble concentrating 0 0 0  Moving slowly or fidgety/restless 0 0 0  Suicidal thoughts 0 0 0  PHQ-9 Score 0 0 3  Difficult doing work/chores - - Not difficult at all     Assessment/ Plan: 54 y.o. female   1. Generalized anxiety disorder with panic attacks Stable.  Continue as needed use of medication.  No refills needed.  Plan for UDS and CSC at next interval checkup  2. Hypertriglyceridemia Check fasting lipid panel - Lipid Panel  3. Elevated serum glucose Check A1c. Last BG 129. - Bayer DCA Hb A1c Waived   Orders Placed This Encounter  Procedures  . ToxASSURE Select 13 (MW), Urine   No orders of the defined types were placed in this encounter.    Janora Norlander, DO [place] [phone]

## 2019-11-15 NOTE — Progress Notes (Signed)
tox 

## 2019-11-16 LAB — LIPID PANEL
Chol/HDL Ratio: 3.3 ratio (ref 0.0–4.4)
Cholesterol, Total: 132 mg/dL (ref 100–199)
HDL: 40 mg/dL (ref >39)
LDL Chol Calc (NIH): 71 mg/dL (ref 0–99)
Triglycerides: 113 mg/dL (ref 0–149)
VLDL Cholesterol Cal: 21 mg/dL (ref 5–40)

## 2019-11-20 ENCOUNTER — Telehealth: Payer: Self-pay

## 2019-11-20 NOTE — Telephone Encounter (Signed)
Pt returned missed call regarding lab results. Reviewed results with pt per Dr Gottschalks note. Pt voiced understanding. 

## 2019-11-28 ENCOUNTER — Encounter: Payer: Self-pay | Admitting: Family Medicine

## 2019-11-29 ENCOUNTER — Ambulatory Visit (INDEPENDENT_AMBULATORY_CARE_PROVIDER_SITE_OTHER): Payer: BC Managed Care – PPO | Admitting: Family Medicine

## 2019-11-29 DIAGNOSIS — J3489 Other specified disorders of nose and nasal sinuses: Secondary | ICD-10-CM

## 2019-11-29 MED ORDER — AZELASTINE HCL 0.1 % NA SOLN: 1.0000 | Freq: Two times a day (BID) | NASAL | 12 refills | Status: DC

## 2019-11-29 NOTE — Progress Notes (Signed)
Telephone visit  Subjective: CC: allergies PCP: Janora Norlander, DO S[facility]:QPRF Summer Brady is a 54 y.o. female calls for telephone consult today. Patient provides verbal consent for consult held via phone.  Due to COVID-19 pandemic this visit was conducted virtually. This visit type was conducted due to national recommendations for restrictions regarding the COVID-19 Pandemic (e.g. social distancing, sheltering in place) in an effort to limit this patient's exposure and mitigate transmission in our community. All issues noted in this document were discussed and addressed.  A physical exam was not performed with this format.   [place] Location of provider: [facility] Others present for call: none  1. Allergies Denies fever.  She reports drainage/ post nasal drip.  She started using an OTC product yesterday.  She is compliant with Singulair/ Allegra, simply saline.  No myalgia, no cough, no headache or facial pain.  ROS: Per HPI  Allergies  Allergen Reactions  . Fluocinolone Rash  . Latex Rash  . Penicillins Rash  . Sulfa Antibiotics Rash  . Ciprofloxacin Rash   Past Medical History:  Diagnosis Date  . Allergy   . GERD (gastroesophageal reflux disease)   . History of kidney stones   . Hyperlipidemia   . Seasonal allergies     Current Outpatient Medications:  .  Ascorbic Acid (VITAMIN C) 1000 MG tablet, Take 1,000 mg by mouth daily., Disp: , Rfl:  .  atorvastatin (LIPITOR) 10 MG tablet, Take 1 tablet (10 mg total) by mouth daily., Disp: 90 tablet, Rfl: 3 .  Fexofenadine HCl (ALLEGRA PO), Take by mouth.  , Disp: , Rfl:  .  LORazepam (ATIVAN) 0.5 MG tablet, Take 1 tablet (0.5 mg total) by mouth daily as needed for anxiety., Disp: 30 tablet, Rfl: 3 .  montelukast (SINGULAIR) 10 MG tablet, Take 1 tablet (10 mg total) by mouth daily., Disp: 90 tablet, Rfl: 3 .  Omega-3 Fatty Acids (FISH OIL) 1000 MG CAPS, Take by mouth daily., Disp: , Rfl:  .  omeprazole (PRILOSEC)  20 MG capsule, Take 1 capsule (20 mg total) by mouth daily., Disp: 90 capsule, Rfl: 3  Assessment/ Plan: 54 y.o. female   Rhinorrhea - Plan: azelastine (ASTELIN) 0.1 % nasal spray  Likely secondary to allergic rhinitis.  Encouraged her to continue with her Allegra and Singulair.  Okay to continue nasal saline spray and then followed by Astelin to dry up drainage.  We discussed that if symptoms worsen for any reason or she develops any signs or symptoms of infectious etiology to contact me back.  She was good understanding of the plan will follow up as needed  Meds ordered this encounter  Medications  . azelastine (ASTELIN) 0.1 % nasal spray    Sig: Place 1 spray into both nostrils 2 (two) times daily.    Dispense:  30 mL    Refill:  12   Start time: 1:16pm End time: 1:21pm  Total time spent on patient care (including telephone call/ virtual visit): 5 minutes  [place], [place] [phone]

## 2020-03-26 ENCOUNTER — Encounter: Payer: Self-pay | Admitting: Family Medicine

## 2020-03-26 ENCOUNTER — Ambulatory Visit: Payer: BC Managed Care – PPO | Admitting: Family Medicine

## 2020-03-26 DIAGNOSIS — J0100 Acute maxillary sinusitis, unspecified: Secondary | ICD-10-CM | POA: Diagnosis not present

## 2020-03-26 MED ORDER — DOXYCYCLINE HYCLATE 100 MG PO CAPS: 100.0000 mg | ORAL_CAPSULE | Freq: Two times a day (BID) | ORAL | 0 refills | Status: DC

## 2020-03-26 NOTE — Progress Notes (Signed)
    Subjective:    Patient ID: JERELINE TICER, female    DOB: 03-May-1965, 55 y.o.   MRN: [id number]   HPI: CARALYNN GELBER is a 55 y.o. female presenting for sinus pressure, runny nose, purulent rhinorrhea stopped up. Using OTC advil cold and sinus with no relief. Similar sx a few months ago responded to doxycycline.   Depression screen [facility] 2/9 11/15/2019 05/15/2019 11/20/2018  Decreased Interest 0 0 3  Down, Depressed, Hopeless 0 0 0  PHQ - 2 Score 0 0 3  Altered sleeping 0 0 0  Tired, decreased energy 0 0 0  Change in appetite 0 0 0  Feeling bad or failure about yourself  0 0 0  Trouble concentrating 0 0 0  Moving slowly or fidgety/restless 0 0 0  Suicidal thoughts 0 0 0  PHQ-9 Score 0 0 3  Difficult doing work/chores - - Not difficult at all     Relevant past medical, surgical, family and social history reviewed and updated as indicated.  Interim medical history since our last visit reviewed. Allergies and medications reviewed and updated.  ROS:  Review of Systems  Constitutional: Negative for fever.  HENT: Positive for congestion, ear pain (a little on the right), postnasal drip, rhinorrhea, sinus pressure and sinus pain. Negative for sore throat.      Social History   Tobacco Use  Smoking Status Never Smoker  Smokeless Tobacco Never Used       Objective:     Wt Readings from Last 3 Encounters:  11/15/19 176 lb (79.8 kg)  05/15/19 173 lb (78.5 kg)  11/20/18 177 lb (80.3 kg)     Exam deferred. Pt. Harboring due to COVID 19. Phone visit performed.   Assessment & Plan:   1. Acute maxillary sinusitis, recurrence not specified     Meds ordered this encounter  Medications  . doxycycline (VIBRAMYCIN) 100 MG capsule    Sig: Take 1 capsule (100 mg total) by mouth 2 (two) times daily.    Dispense:  20 capsule    Refill:  0    No orders of the defined types were placed in this encounter.     Diagnoses and all orders for this visit:  Acute maxillary sinusitis,  recurrence not specified  Other orders -     doxycycline (VIBRAMYCIN) 100 MG capsule; Take 1 capsule (100 mg total) by mouth 2 (two) times daily.    Virtual Visit via telephone Note  I discussed the limitations, risks, security and privacy concerns of performing an evaluation and management service by telephone and the availability of in person appointments. The patient was identified with two identifiers. Pt.expressed understanding and agreed to proceed. Pt. Is at home. Dr. Livia Snellen is in his office.  Follow Up Instructions:   I discussed the assessment and treatment plan with the patient. The patient was provided an opportunity to ask questions and all were answered. The patient agreed with the plan and demonstrated an understanding of the instructions.   The patient was advised to call back or seek an in-person evaluation if the symptoms worsen or if the condition fails to improve as anticipated.   Total minutes including chart review and phone contact time: 11   Follow up plan: Return if symptoms worsen or fail to improve.  Claretta Fraise, MD [place]

## 2020-04-13 ENCOUNTER — Telehealth: Payer: Self-pay

## 2020-04-13 ENCOUNTER — Other Ambulatory Visit: Payer: Self-pay | Admitting: *Deleted

## 2020-04-13 DIAGNOSIS — E781 Pure hyperglyceridemia: Secondary | ICD-10-CM

## 2020-04-13 DIAGNOSIS — E782 Mixed hyperlipidemia: Secondary | ICD-10-CM

## 2020-04-13 DIAGNOSIS — Z0000 Encounter for general adult medical examination without abnormal findings: Secondary | ICD-10-CM

## 2020-04-13 DIAGNOSIS — K219 Gastro-esophageal reflux disease without esophagitis: Secondary | ICD-10-CM

## 2020-04-13 NOTE — Telephone Encounter (Signed)
Labs ordered. Pt aware.

## 2020-04-15 ENCOUNTER — Other Ambulatory Visit: Payer: Self-pay | Admitting: Obstetrics and Gynecology

## 2020-04-15 ENCOUNTER — Ambulatory Visit
Admission: RE | Admit: 2020-04-15 | Discharge: 2020-04-15 | Disposition: A | Discharge status: Home / Self Care | Payer: BC Managed Care – PPO | Source: Ambulatory Visit | Attending: Obstetrics and Gynecology | Admitting: Obstetrics and Gynecology

## 2020-04-15 ENCOUNTER — Other Ambulatory Visit: Payer: Self-pay

## 2020-04-15 DIAGNOSIS — R921 Mammographic calcification found on diagnostic imaging of breast: Secondary | ICD-10-CM | POA: Diagnosis not present

## 2020-04-27 DIAGNOSIS — L820 Inflamed seborrheic keratosis: Secondary | ICD-10-CM | POA: Diagnosis not present

## 2020-04-27 DIAGNOSIS — Z85828 Personal history of other malignant neoplasm of skin: Secondary | ICD-10-CM | POA: Diagnosis not present

## 2020-04-27 DIAGNOSIS — L821 Other seborrheic keratosis: Secondary | ICD-10-CM | POA: Diagnosis not present

## 2020-04-27 DIAGNOSIS — L720 Epidermal cyst: Secondary | ICD-10-CM | POA: Diagnosis not present

## 2020-04-27 DIAGNOSIS — L738 Other specified follicular disorders: Secondary | ICD-10-CM | POA: Diagnosis not present

## 2020-05-12 ENCOUNTER — Other Ambulatory Visit: Payer: Self-pay | Admitting: Family Medicine

## 2020-05-12 DIAGNOSIS — K219 Gastro-esophageal reflux disease without esophagitis: Secondary | ICD-10-CM

## 2020-05-15 ENCOUNTER — Other Ambulatory Visit: Payer: BC Managed Care – PPO

## 2020-05-15 ENCOUNTER — Other Ambulatory Visit: Payer: Self-pay

## 2020-05-15 DIAGNOSIS — E781 Pure hyperglyceridemia: Secondary | ICD-10-CM

## 2020-05-15 DIAGNOSIS — E782 Mixed hyperlipidemia: Secondary | ICD-10-CM

## 2020-05-15 DIAGNOSIS — K219 Gastro-esophageal reflux disease without esophagitis: Secondary | ICD-10-CM | POA: Diagnosis not present

## 2020-05-15 DIAGNOSIS — Z0000 Encounter for general adult medical examination without abnormal findings: Secondary | ICD-10-CM

## 2020-05-16 LAB — CBC WITH DIFFERENTIAL/PLATELET
Basophils Absolute: 0 10*3/uL (ref 0.0–0.2)
Basos: 1 %
EOS (ABSOLUTE): 0.1 10*3/uL (ref 0.0–0.4)
Eos: 1 %
Hematocrit: 39.8 % (ref 34.0–46.6)
Hemoglobin: 13.7 g/dL (ref 11.1–15.9)
Immature Grans (Abs): 0 10*3/uL (ref 0.0–0.1)
Immature Granulocytes: 0 %
Lymphocytes Absolute: 2.5 10*3/uL (ref 0.7–3.1)
Lymphs: 40 %
MCH: 31.3 pg (ref 26.6–33.0)
MCHC: 34.4 g/dL (ref 31.5–35.7)
MCV: 91 fL (ref 79–97)
Monocytes Absolute: 0.4 10*3/uL (ref 0.1–0.9)
Monocytes: 6 %
Neutrophils Absolute: 3.3 10*3/uL (ref 1.4–7.0)
Neutrophils: 52 %
Platelets: 192 10*3/uL (ref 150–450)
RBC: 4.38 x10E6/uL (ref 3.77–5.28)
RDW: 12.9 % (ref 11.7–15.4)
WBC: 6.3 10*3/uL (ref 3.4–10.8)

## 2020-05-16 LAB — CMP14+EGFR
ALT: 25 IU/L (ref 0–32)
AST: 20 IU/L (ref 0–40)
Albumin/Globulin Ratio: 2.6 — ABNORMAL HIGH (ref 1.2–2.2)
Albumin: 4.7 g/dL (ref 3.8–4.9)
Alkaline Phosphatase: 116 IU/L (ref 44–121)
BUN/Creatinine Ratio: 11 (ref 9–23)
BUN: 8 mg/dL (ref 6–24)
Bilirubin Total: 1.4 mg/dL — ABNORMAL HIGH (ref 0.0–1.2)
CO2: 25 mmol/L (ref 20–29)
Calcium: 9.2 mg/dL (ref 8.7–10.2)
Chloride: 107 mmol/L — ABNORMAL HIGH (ref 96–106)
Creatinine, Ser: 0.7 mg/dL (ref 0.57–1.00)
Globulin, Total: 1.8 g/dL (ref 1.5–4.5)
Glucose: 114 mg/dL — ABNORMAL HIGH (ref 65–99)
Potassium: 3.8 mmol/L (ref 3.5–5.2)
Sodium: 147 mmol/L — ABNORMAL HIGH (ref 134–144)
Total Protein: 6.5 g/dL (ref 6.0–8.5)
eGFR: 101 mL/min/{1.73_m2} (ref >59)

## 2020-05-16 LAB — LIPID PANEL
Chol/HDL Ratio: 3.3 ratio (ref 0.0–4.4)
Cholesterol, Total: 142 mg/dL (ref 100–199)
HDL: 43 mg/dL (ref >39)
LDL Chol Calc (NIH): 77 mg/dL (ref 0–99)
Triglycerides: 122 mg/dL (ref 0–149)
VLDL Cholesterol Cal: 22 mg/dL (ref 5–40)

## 2020-05-20 ENCOUNTER — Ambulatory Visit: Payer: BC Managed Care – PPO | Admitting: Family Medicine

## 2020-05-20 ENCOUNTER — Encounter: Payer: Self-pay | Admitting: Family Medicine

## 2020-05-20 VITALS — BP 123/72 | HR 81 | Temp 97.5°F | Ht 67.0 in | Wt 173.2 lb

## 2020-05-20 DIAGNOSIS — Z79899 Other long term (current) drug therapy: Secondary | ICD-10-CM

## 2020-05-20 DIAGNOSIS — K219 Gastro-esophageal reflux disease without esophagitis: Secondary | ICD-10-CM

## 2020-05-20 DIAGNOSIS — F411 Generalized anxiety disorder: Secondary | ICD-10-CM

## 2020-05-20 DIAGNOSIS — J301 Allergic rhinitis due to pollen: Secondary | ICD-10-CM

## 2020-05-20 DIAGNOSIS — E782 Mixed hyperlipidemia: Secondary | ICD-10-CM

## 2020-05-20 DIAGNOSIS — F410 Panic disorder [episodic paroxysmal anxiety] without agoraphobia: Secondary | ICD-10-CM

## 2020-05-20 DIAGNOSIS — Z79891 Long term (current) use of opiate analgesic: Secondary | ICD-10-CM | POA: Diagnosis not present

## 2020-05-20 MED ORDER — ATORVASTATIN CALCIUM 10 MG PO TABS: 10.0000 mg | ORAL_TABLET | Freq: Every day | ORAL | 3 refills | Status: DC

## 2020-05-20 MED ORDER — LORAZEPAM 0.5 MG PO TABS: 0.5000 mg | ORAL_TABLET | Freq: Every day | ORAL | 3 refills | Status: DC | PRN

## 2020-05-20 MED ORDER — MONTELUKAST SODIUM 10 MG PO TABS: 10.0000 mg | ORAL_TABLET | Freq: Every day | ORAL | 3 refills | Status: DC

## 2020-05-20 MED ORDER — OMEPRAZOLE 20 MG PO CPDR: 20.0000 mg | DELAYED_RELEASE_CAPSULE | Freq: Every day | ORAL | 3 refills | Status: DC

## 2020-05-20 NOTE — Progress Notes (Signed)
Subjective: CC: Follow-up anxiety disorder, hyperlipidemia, allergic rhinitis PCP: Janora Norlander, DO Summer Brady is a 55 y.o. female presenting to clinic today for:  1.  Anxiety disorder Patient reports as needed use, very sparing less than a few times per year, of Ativan.  She denies any excessive daytime sedation with use.  No falls, no respiratory depression, no visual or auditory hallucinations.  2.  Hyperlipidemia Patient is compliant with Lipitor 10 mg daily.  Needs refills.  No reports chest pain, shortness of breath or abdominal pain  3.  GERD Patient reports fairly good control of GERD with omeprazole and Pepcid daily.  She does sometimes have some belching but denies any overt burning or abdominal pain.  4.  Allergic rhinitis Patient reports good control of allergy symptoms for the most part with Singulair and Astelin but she is been having some breakthrough symptoms since the drop in pollen.  She is currently taking Allegra but is considering switching off to something else and just wants to make sure that she can do this safely   ROS: Per HPI  Allergies  Allergen Reactions  . Fluocinolone Rash  . Latex Rash  . Penicillins Rash  . Sulfa Antibiotics Rash  . Ciprofloxacin Rash   Past Medical History:  Diagnosis Date  . Allergy   . GERD (gastroesophageal reflux disease)   . History of kidney stones   . Hyperlipidemia   . Seasonal allergies     Current Outpatient Medications:  .  Ascorbic Acid (VITAMIN C) 1000 MG tablet, Take 1,000 mg by mouth daily., Disp: , Rfl:  .  atorvastatin (LIPITOR) 10 MG tablet, Take 1 tablet (10 mg total) by mouth daily., Disp: 90 tablet, Rfl: 3 .  azelastine (ASTELIN) 0.1 % nasal spray, Place 1 spray into both nostrils 2 (two) times daily., Disp: 30 mL, Rfl: 12 .  famotidine (PEPCID) 20 MG tablet, TAKE ONE TABLET DAILY AT BEDTIME, Disp: 90 tablet, Rfl: 3 .  Fexofenadine HCl (ALLEGRA PO), Take by mouth., Disp: , Rfl:  .   montelukast (SINGULAIR) 10 MG tablet, Take 1 tablet (10 mg total) by mouth daily., Disp: 90 tablet, Rfl: 3 .  Omega-3 Fatty Acids (FISH OIL) 1000 MG CAPS, Take by mouth daily., Disp: , Rfl:  .  omeprazole (PRILOSEC) 20 MG capsule, Take 1 capsule (20 mg total) by mouth daily., Disp: 90 capsule, Rfl: 3 .  LORazepam (ATIVAN) 0.5 MG tablet, Take 1 tablet (0.5 mg total) by mouth daily as needed for anxiety. (Patient not taking: Reported on 05/20/2020), Disp: 30 tablet, Rfl: 3 Social History   Socioeconomic History  . Marital status: Married    Spouse name: Not on file  . Number of children: 0  . Years of education: 12th grade  . Highest education level: Not on file  Occupational History  . Occupation: office work    Fish farm manager: [employer]  Tobacco Use  . Smoking status: Never Smoker  . Smokeless tobacco: Never Used  Vaping Use  . Vaping Use: Never used  Substance and Sexual Activity  . Alcohol use: No  . Drug use: No  . Sexual activity: Yes    Birth control/protection: Surgical  Other Topics Concern  . Not on file  Social History Narrative  . Not on file   Social Determinants of Health   Financial Resource Strain: Not on file  Food Insecurity: Not on file  Transportation Needs: Not on file  Physical Activity: Not on file  Stress:  Not on file  Social Connections: Not on file  Intimate Partner Violence: Not on file   Family History  Problem Relation Age of Onset  . Hypertension Mother   . Hyperlipidemia Mother   . Heart attack Father   . Hypertension Father   . Cancer Father        prostate   . Diabetes Father   . Hypertension Brother   . Diabetes Maternal Grandmother   . Cancer Maternal Grandfather        lung  . Diabetes Paternal Grandfather   . Breast cancer Maternal Aunt   . Colon cancer Neg Hx     Objective: Office vital signs reviewed. BP 123/72   Pulse 81   Temp (!) 97.5 F (36.4 C)   Ht 5\' 7"  (1.702 m)   Wt 173 lb 3.2 oz (78.6 kg)   SpO2 100%   BMI  27.13 kg/m   Physical Examination:  General: Awake, alert, well nourished, No acute distress HEENT: Normal; sclera white.  No rhinorrhea Cardio: regular rate and rhythm, S1S2 heard, no murmurs appreciated Pulm: clear to auscultation bilaterally, no wheezes, rhonchi or rales; normal work of breathing on room air Extremities: warm, well perfused, No edema, cyanosis or clubbing; +2 pulses bilaterally  Assessment/ Plan: 55 y.o. female   Generalized anxiety disorder with panic attacks - Plan: ToxASSURE Select 13 (MW), Urine, LORazepam (ATIVAN) 0.5 MG tablet  Controlled substance agreement signed - Plan: ToxASSURE Select 13 (MW), Urine  Gastroesophageal reflux disease without esophagitis - Plan: omeprazole (PRILOSEC) 20 MG capsule  Mixed hyperlipidemia - Plan: atorvastatin (LIPITOR) 10 MG tablet  Seasonal allergic rhinitis due to pollen - Plan: montelukast (SINGULAIR) 10 MG tablet  Anxiety disorder stable with very sparing use of Ativan.  She has not had this filled since 2020.  No concerns for diversion.  UDS and CSC were obtained as per office policy.  Ativan renewed for as needed use  GERD is stable with Prilosec and Pepcid.  Continue Lipitor.  Singulair renewed.  Continue Astelin nasal spray.  Okay to switch off to Claritin or Zyrtec if needed   Orders Placed This Encounter  Procedures  . ToxASSURE Select 13 (MW), Urine   Meds ordered this encounter  Medications  . LORazepam (ATIVAN) 0.5 MG tablet    Sig: Take 1 tablet (0.5 mg total) by mouth daily as needed for anxiety.    Dispense:  30 tablet    Refill:  3  . atorvastatin (LIPITOR) 10 MG tablet    Sig: Take 1 tablet (10 mg total) by mouth daily.    Dispense:  90 tablet    Refill:  3  . montelukast (SINGULAIR) 10 MG tablet    Sig: Take 1 tablet (10 mg total) by mouth daily.    Dispense:  90 tablet    Refill:  3  . omeprazole (PRILOSEC) 20 MG capsule    Sig: Take 1 capsule (20 mg total) by mouth daily.    Dispense:   90 capsule    Refill:  [place], [place] [phone]

## 2020-05-27 LAB — TOXASSURE SELECT 13 (MW), URINE

## 2020-06-27 ENCOUNTER — Other Ambulatory Visit: Payer: Self-pay | Admitting: Family Medicine

## 2020-06-27 DIAGNOSIS — K219 Gastro-esophageal reflux disease without esophagitis: Secondary | ICD-10-CM

## 2020-06-27 DIAGNOSIS — J301 Allergic rhinitis due to pollen: Secondary | ICD-10-CM

## 2020-06-27 DIAGNOSIS — E782 Mixed hyperlipidemia: Secondary | ICD-10-CM

## 2020-07-27 DIAGNOSIS — Z01419 Encounter for gynecological examination (general) (routine) without abnormal findings: Secondary | ICD-10-CM | POA: Diagnosis not present

## 2020-07-27 DIAGNOSIS — Z6827 Body mass index (BMI) 27.0-27.9, adult: Secondary | ICD-10-CM | POA: Diagnosis not present

## 2020-07-28 ENCOUNTER — Other Ambulatory Visit: Payer: Self-pay | Admitting: Obstetrics and Gynecology

## 2020-07-28 DIAGNOSIS — M8580 Other specified disorders of bone density and structure, unspecified site: Secondary | ICD-10-CM

## 2020-10-20 ENCOUNTER — Ambulatory Visit
Admission: RE | Admit: 2020-10-20 | Discharge: 2020-10-20 | Disposition: A | Discharge status: Home / Self Care | Payer: BC Managed Care – PPO | Source: Ambulatory Visit | Attending: Obstetrics and Gynecology | Admitting: Obstetrics and Gynecology

## 2020-10-20 ENCOUNTER — Other Ambulatory Visit: Payer: Self-pay | Admitting: Obstetrics and Gynecology

## 2020-10-20 DIAGNOSIS — R922 Inconclusive mammogram: Secondary | ICD-10-CM | POA: Diagnosis not present

## 2020-10-20 DIAGNOSIS — R921 Mammographic calcification found on diagnostic imaging of breast: Secondary | ICD-10-CM

## 2020-11-09 ENCOUNTER — Encounter: Payer: Self-pay | Admitting: Family Medicine

## 2020-11-09 ENCOUNTER — Other Ambulatory Visit: Payer: Self-pay | Admitting: Family Medicine

## 2020-11-09 DIAGNOSIS — R739 Hyperglycemia, unspecified: Secondary | ICD-10-CM

## 2020-11-09 DIAGNOSIS — E870 Hyperosmolality and hypernatremia: Secondary | ICD-10-CM

## 2020-11-13 ENCOUNTER — Encounter: Payer: Self-pay | Admitting: Family Medicine

## 2020-11-13 ENCOUNTER — Ambulatory Visit: Payer: BC Managed Care – PPO | Admitting: Family Medicine

## 2020-11-13 DIAGNOSIS — J0100 Acute maxillary sinusitis, unspecified: Secondary | ICD-10-CM | POA: Diagnosis not present

## 2020-11-13 MED ORDER — DOXYCYCLINE HYCLATE 100 MG PO TABS: 100.0000 mg | ORAL_TABLET | Freq: Two times a day (BID) | ORAL | 0 refills | Status: DC

## 2020-11-13 NOTE — Progress Notes (Signed)
Virtual Visit via telephone Note Due to COVID-19 pandemic this visit was conducted virtually. This visit type was conducted due to national recommendations for restrictions regarding the COVID-19 Pandemic (e.g. social distancing, sheltering in place) in an effort to limit this patient's exposure and mitigate transmission in our community. All issues noted in this document were discussed and addressed.  A physical exam was not performed with this format.   I connected with Summer Brady on 11/13/2020 at 0850 by telephone and verified that I am speaking with the correct person using two identifiers. Summer Brady is currently located at home and  no one  is currently with them during visit. The provider, Monia Pouch, FNP is located in their office at time of visit.  I discussed the limitations, risks, security and privacy concerns of performing an evaluation and management service by telephone and the availability of in person appointments. I also discussed with the patient that there may be a patient responsible charge related to this service. The patient expressed understanding and agreed to proceed.  Subjective:  Patient ID: Summer Brady, female    DOB: Jun 01, 1965, 55 y.o.   MRN: [id number]  Chief Complaint:  Sinus Problem   HPI: Summer Brady is a 55 y.o. female presenting on 11/13/2020 for Sinus Problem   Pt reports ongoing maxillary sinus pressure with purulent nasal drainage and sputum production for over 10 days. She has been using Mucinex and Delsym without relief of symptoms.   Sinus Problem This is a new problem. The current episode started 1 to 4 weeks ago. The problem has been gradually worsening since onset. Her pain is at a severity of 6/10. The pain is moderate. Associated symptoms include congestion, coughing, ear pain, headaches and sinus pressure. Pertinent negatives include no chills, diaphoresis, hoarse voice, neck pain, shortness of breath, sneezing, sore throat or swollen  glands. Past treatments include oral decongestants. The treatment provided no relief.    Relevant past medical, surgical, family, and social history reviewed and updated as indicated.  Allergies and medications reviewed and updated.   Past Medical History:  Diagnosis Date   Allergy    GERD (gastroesophageal reflux disease)    History of kidney stones    Hyperlipidemia    Seasonal allergies     Past Surgical History:  Procedure Laterality Date   BREAST BIOPSY Left 03/2016   benign   LUMBAR SPINE SURGERY  2009   MOHS SURGERY  09/2013   nose   RADIOACTIVE SEED GUIDED EXCISIONAL BREAST BIOPSY Left 03/31/2016   Procedure: RADIOACTIVE SEED GUIDED EXCISIONAL LEFT BREAST BIOPSY;  Surgeon: [name], MD;  Location: [facility];  Service: General;  Laterality: Left;   TOTAL ABDOMINAL HYSTERECTOMY  1997   WRIST FRACTURE SURGERY Right 2012    Social History   Socioeconomic History   Marital status: Married    Spouse name: Not on file   Number of children: 0   Years of education: 12th grade   Highest education level: Not on file  Occupational History   Occupation: office work    Fish farm manager: [employer]  Tobacco Use   Smoking status: Never   Smokeless tobacco: Never  Vaping Use   Vaping Use: Never used  Substance and Sexual Activity   Alcohol use: No   Drug use: No   Sexual activity: Yes    Birth control/protection: Surgical  Other Topics Concern   Not on file  Social History Narrative   Not  on file   Social Determinants of Health   Financial Resource Strain: Not on file  Food Insecurity: Not on file  Transportation Needs: Not on file  Physical Activity: Not on file  Stress: Not on file  Social Connections: Not on file  Intimate Partner Violence: Not on file    Outpatient Encounter Medications as of 11/13/2020  Medication Sig   doxycycline (VIBRA-TABS) 100 MG tablet Take 1 tablet (100 mg total) by mouth 2 (two) times daily for 10 days. 1 po bid    Ascorbic Acid (VITAMIN C) 1000 MG tablet Take 1,000 mg by mouth daily.   atorvastatin (LIPITOR) 10 MG tablet Take 1 tablet (10 mg total) by mouth daily.   azelastine (ASTELIN) 0.1 % nasal spray Place 1 spray into both nostrils 2 (two) times daily.   famotidine (PEPCID) 20 MG tablet TAKE ONE TABLET DAILY AT BEDTIME   Fexofenadine HCl (ALLEGRA PO) Take by mouth.   LORazepam (ATIVAN) 0.5 MG tablet Take 1 tablet (0.5 mg total) by mouth daily as needed for anxiety.   montelukast (SINGULAIR) 10 MG tablet Take 1 tablet (10 mg total) by mouth daily.   Omega-3 Fatty Acids (FISH OIL) 1000 MG CAPS Take by mouth daily.   omeprazole (PRILOSEC) 20 MG capsule Take 1 capsule (20 mg total) by mouth daily.   No facility-administered encounter medications on file as of 11/13/2020.    Allergies  Allergen Reactions   Fluocinolone Rash   Latex Rash   Penicillins Rash   Sulfa Antibiotics Rash   Ciprofloxacin Rash    Review of Systems  Constitutional:  Negative for activity change, appetite change, chills, diaphoresis, fatigue, fever and unexpected weight change.  HENT:  Positive for congestion, ear pain, postnasal drip, rhinorrhea, sinus pressure and sinus pain. Negative for hoarse voice, sneezing and sore throat.   Eyes: Negative.   Respiratory:  Positive for cough. Negative for chest tightness and shortness of breath.   Cardiovascular:  Negative for chest pain, palpitations and leg swelling.  Gastrointestinal:  Negative for blood in stool, constipation, diarrhea, nausea and vomiting.  Endocrine: Negative.   Genitourinary:  Negative for decreased urine volume, difficulty urinating, dysuria, frequency and urgency.  Musculoskeletal:  Negative for arthralgias, myalgias and neck pain.  Skin: Negative.   Allergic/Immunologic: Negative.   Neurological:  Positive for headaches. Negative for dizziness.  Hematological: Negative.   Psychiatric/Behavioral:  Negative for confusion, hallucinations, sleep  disturbance and suicidal ideas.   All other systems reviewed and are negative.       Observations/Objective: No vital signs or physical exam, this was a telephone or virtual health encounter.  Pt alert and oriented, answers all questions appropriately, and able to speak in full sentences.    Assessment and Plan: Emory was seen today for sinus problem.  Diagnoses and all orders for this visit:  Acute non-recurrent maxillary sinusitis Ongoing symptoms for over 10 days with failed symptomatic care at home. Will initiate doxycycline. Pt aware to continue Mucinex and Flonase. Report any new, worsening, or persistent symptoms.  -     doxycycline (VIBRA-TABS) 100 MG tablet; Take 1 tablet (100 mg total) by mouth 2 (two) times daily for 10 days. 1 po bid    Follow Up Instructions: Return if symptoms worsen or fail to improve.    I discussed the assessment and treatment plan with the patient. The patient was provided an opportunity to ask questions and all were answered. The patient agreed with the plan and demonstrated an understanding of  the instructions.   The patient was advised to call back or seek an in-person evaluation if the symptoms worsen or if the condition fails to improve as anticipated.  The above assessment and management plan was discussed with the patient. The patient verbalized understanding of and has agreed to the management plan. Patient is aware to call the clinic if they develop any new symptoms or if symptoms persist or worsen. Patient is aware when to return to the clinic for a follow-up visit. Patient educated on when it is appropriate to go to the emergency department.    I provided 11 minutes of non-face-to-face time during this encounter. The call started at 0850. The call ended at 0900. The other time was used for coordination of care.    Monia Pouch, FNP-C [facility] [address] [place], [place] [address] [phone] 11/13/2020

## 2020-11-20 ENCOUNTER — Other Ambulatory Visit: Payer: Self-pay

## 2020-11-20 ENCOUNTER — Other Ambulatory Visit: Payer: BC Managed Care – PPO

## 2020-11-20 DIAGNOSIS — E870 Hyperosmolality and hypernatremia: Secondary | ICD-10-CM

## 2020-11-20 DIAGNOSIS — R739 Hyperglycemia, unspecified: Secondary | ICD-10-CM | POA: Diagnosis not present

## 2020-11-20 LAB — BAYER DCA HB A1C WAIVED: HB A1C (BAYER DCA - WAIVED): 5.6 % (ref 4.8–5.6)

## 2020-11-21 LAB — BASIC METABOLIC PANEL
BUN/Creatinine Ratio: 13 (ref 9–23)
BUN: 9 mg/dL (ref 6–24)
CO2: 26 mmol/L (ref 20–29)
Calcium: 9.1 mg/dL (ref 8.7–10.2)
Chloride: 103 mmol/L (ref 96–106)
Creatinine, Ser: 0.7 mg/dL (ref 0.57–1.00)
Glucose: 130 mg/dL — ABNORMAL HIGH (ref 70–99)
Potassium: 3.6 mmol/L (ref 3.5–5.2)
Sodium: 142 mmol/L (ref 134–144)
eGFR: 101 mL/min/{1.73_m2} (ref >59)

## 2020-11-23 ENCOUNTER — Encounter: Payer: Self-pay | Admitting: Family Medicine

## 2020-11-23 ENCOUNTER — Other Ambulatory Visit: Payer: Self-pay

## 2020-11-23 ENCOUNTER — Ambulatory Visit: Payer: BC Managed Care – PPO | Admitting: Family Medicine

## 2020-11-23 VITALS — BP 108/72 | HR 79 | Temp 98.3°F | Ht 67.0 in | Wt 174.8 lb

## 2020-11-23 DIAGNOSIS — F411 Generalized anxiety disorder: Secondary | ICD-10-CM

## 2020-11-23 DIAGNOSIS — E782 Mixed hyperlipidemia: Secondary | ICD-10-CM | POA: Diagnosis not present

## 2020-11-23 DIAGNOSIS — M85852 Other specified disorders of bone density and structure, left thigh: Secondary | ICD-10-CM | POA: Diagnosis not present

## 2020-11-23 DIAGNOSIS — R739 Hyperglycemia, unspecified: Secondary | ICD-10-CM

## 2020-11-23 DIAGNOSIS — Z130 Encounter for screening for diseases of the blood and blood-forming organs and certain disorders involving the immune mechanism: Secondary | ICD-10-CM

## 2020-11-23 DIAGNOSIS — F410 Panic disorder [episodic paroxysmal anxiety] without agoraphobia: Secondary | ICD-10-CM

## 2020-11-23 MED ORDER — LORAZEPAM 0.5 MG PO TABS: 0.5000 mg | ORAL_TABLET | Freq: Every day | ORAL | 3 refills | Status: DC | PRN

## 2020-11-23 NOTE — Progress Notes (Signed)
Subjective: CC: Follow-up hyperlipidemia, elevated blood sugar, anxiety disorder PCP: Janora Norlander, DO ONG:EXBM B Sunga is a 55 y.o. female presenting to clinic today for:  1.  Hyperlipidemia/elevated blood sugar Patient is compliant with her fish oils/lipitor.  Not yet due for fasting lipid panel.  No chest pain, shortness of breath or change in exercise tolerance.  She had A1c performed which showed no evidence of diabetes or prediabetes  2.  Anxiety disorder with panic Patient reports intermittent use of Ativan.  No reports of excessive daytime sedation, falls or respiratory depression.  Still has several tablets left but her current Rx is expired so would appreciate a future order   ROS: Per HPI  Allergies  Allergen Reactions   Fluocinolone Rash   Latex Rash   Penicillins Rash   Sulfa Antibiotics Rash   Ciprofloxacin Rash   Past Medical History:  Diagnosis Date   Allergy    GERD (gastroesophageal reflux disease)    History of kidney stones    Hyperlipidemia    Seasonal allergies     Current Outpatient Medications:    Ascorbic Acid (VITAMIN C) 1000 MG tablet, Take 1,000 mg by mouth daily., Disp: , Rfl:    atorvastatin (LIPITOR) 10 MG tablet, Take 1 tablet (10 mg total) by mouth daily., Disp: 90 tablet, Rfl: 3   azelastine (ASTELIN) 0.1 % nasal spray, Place 1 spray into both nostrils 2 (two) times daily., Disp: 30 mL, Rfl: 12   doxycycline (VIBRA-TABS) 100 MG tablet, Take 1 tablet (100 mg total) by mouth 2 (two) times daily for 10 days. 1 po bid, Disp: 20 tablet, Rfl: 0   famotidine (PEPCID) 20 MG tablet, TAKE ONE TABLET DAILY AT BEDTIME, Disp: 90 tablet, Rfl: 3   Fexofenadine HCl (ALLEGRA PO), Take by mouth., Disp: , Rfl:    LORazepam (ATIVAN) 0.5 MG tablet, Take 1 tablet (0.5 mg total) by mouth daily as needed for anxiety., Disp: 30 tablet, Rfl: 3   montelukast (SINGULAIR) 10 MG tablet, Take 1 tablet (10 mg total) by mouth daily., Disp: 90 tablet, Rfl: 3    Omega-3 Fatty Acids (FISH OIL) 1000 MG CAPS, Take by mouth daily., Disp: , Rfl:    omeprazole (PRILOSEC) 20 MG capsule, Take 1 capsule (20 mg total) by mouth daily., Disp: 90 capsule, Rfl: 3 Social History   Socioeconomic History   Marital status: Married    Spouse name: Not on file   Number of children: 0   Years of education: 12th grade   Highest education level: Not on file  Occupational History   Occupation: office work    Fish farm manager: [employer]  Tobacco Use   Smoking status: Never   Smokeless tobacco: Never  Vaping Use   Vaping Use: Never used  Substance and Sexual Activity   Alcohol use: No   Drug use: No   Sexual activity: Yes    Birth control/protection: Surgical  Other Topics Concern   Not on file  Social History Narrative   Not on file   Social Determinants of Health   Financial Resource Strain: Not on file  Food Insecurity: Not on file  Transportation Needs: Not on file  Physical Activity: Not on file  Stress: Not on file  Social Connections: Not on file  Intimate Partner Violence: Not on file   Family History  Problem Relation Age of Onset   Hypertension Mother    Hyperlipidemia Mother    Heart attack Father    Hypertension Father  Cancer Father        prostate    Diabetes Father    Hypertension Brother    Diabetes Maternal Grandmother    Cancer Maternal Grandfather        lung   Diabetes Paternal Grandfather    Breast cancer Maternal Aunt    Colon cancer Neg Hx     Objective: Office vital signs reviewed. BP 108/72   Pulse 79   Temp 98.3 F (36.8 C)   Ht 5' 7"  (1.702 m)   Wt 174 lb 12.8 oz (79.3 kg)   SpO2 97%   BMI 27.38 kg/m   Physical Examination:  General: Awake, alert, well nourished, No acute distress.  No carotid bruits Cardio: regular rate and rhythm, S1S2 heard, no murmurs appreciated Pulm: clear to auscultation bilaterally, no wheezes, rhonchi or rales; normal work of breathing on room air Psych: Mood stable, speech  normal, affect appropriate.  Patient is very pleasant and interactive  Assessment/ Plan: 55 y.o. female   Generalized anxiety disorder with panic attacks - Plan: LORazepam (ATIVAN) 0.5 MG tablet  Mixed hyperlipidemia - Plan: Lipid panel, CMP14+EGFR, TSH  Osteopenia of neck of left femur - Plan: CMP14+EGFR, VITAMIN D 25 Hydroxy (Vit-D Deficiency, Fractures)  Screening, anemia, deficiency, iron - Plan: CBC  Elevated serum glucose - Plan: Bayer DCA Hb A1c Waived  Anxiety disorder is stable with sparing use of Ativan.  National narcotic database reviewed and there were no red flags.  Future order placed and will be put on file  Plan for fasting labs at next visit.  I have included lipid, CMP, TSH, vitamin D given known osteopenia.  She has a DEXA scan scheduled for January  We reviewed most recent laboratory work-up which showed no evidence of diabetes despite elevation of serum glucose  No orders of the defined types were placed in this encounter.  No orders of the defined types were placed in this encounter.    Janora Norlander, DO [place] [phone]

## 2021-01-22 ENCOUNTER — Other Ambulatory Visit: Payer: BC Managed Care – PPO

## 2021-03-10 ENCOUNTER — Encounter: Payer: Self-pay | Admitting: Family Medicine

## 2021-03-11 ENCOUNTER — Encounter: Payer: Self-pay | Admitting: Family Medicine

## 2021-03-11 ENCOUNTER — Ambulatory Visit: Payer: BC Managed Care – PPO | Admitting: Family Medicine

## 2021-03-11 ENCOUNTER — Telehealth: Payer: Self-pay | Admitting: Family Medicine

## 2021-03-11 ENCOUNTER — Ambulatory Visit (INDEPENDENT_AMBULATORY_CARE_PROVIDER_SITE_OTHER): Payer: BC Managed Care – PPO

## 2021-03-11 VITALS — BP 128/66 | HR 89 | Temp 98.4°F | Ht 67.0 in | Wt 178.5 lb

## 2021-03-11 DIAGNOSIS — N200 Calculus of kidney: Secondary | ICD-10-CM | POA: Diagnosis not present

## 2021-03-11 DIAGNOSIS — R3129 Other microscopic hematuria: Secondary | ICD-10-CM | POA: Diagnosis not present

## 2021-03-11 DIAGNOSIS — R300 Dysuria: Secondary | ICD-10-CM

## 2021-03-11 DIAGNOSIS — R109 Unspecified abdominal pain: Secondary | ICD-10-CM

## 2021-03-11 DIAGNOSIS — Z87442 Personal history of urinary calculi: Secondary | ICD-10-CM

## 2021-03-11 LAB — MICROSCOPIC EXAMINATION: RBC, Urine: >30 /hpf — AB (ref 0–2)

## 2021-03-11 LAB — URINALYSIS, ROUTINE W REFLEX MICROSCOPIC
Bilirubin, UA: NEGATIVE
Glucose, UA: NEGATIVE
Ketones, UA: NEGATIVE
Leukocytes,UA: NEGATIVE
Nitrite, UA: NEGATIVE
Protein,UA: NEGATIVE
Specific Gravity, UA: 1.015 (ref 1.005–1.030)
Urobilinogen, Ur: 0.2 mg/dL (ref 0.2–1.0)
pH, UA: 6 (ref 5.0–7.5)

## 2021-03-11 MED ORDER — ALFUZOSIN HCL ER 10 MG PO TB24: 10.0000 mg | ORAL_TABLET | Freq: Every day | ORAL | 0 refills | Status: AC

## 2021-03-11 MED ORDER — KETOROLAC TROMETHAMINE 60 MG/2ML IM SOLN
60.0000 mg | Freq: Once | INTRAMUSCULAR | Status: AC
  Administered 2021-03-11: 60 mg via INTRAMUSCULAR

## 2021-03-11 NOTE — Telephone Encounter (Signed)
Pt called stating that she just got off the phone with [facility] who told her that they received pts referral but needs OV notes and any labs/imaging regarding referral ASAP. ?

## 2021-03-11 NOTE — Progress Notes (Signed)
Subjective:  Patient ID: Summer Brady, female    DOB: 1965/12/04, 56 y.o.   MRN: [id number]  Patient Care Team: Janora Norlander, DO as PCP - General (Family Medicine)   Chief Complaint:  Dysuria   HPI: Summer Brady is a 56 y.o. female presenting on 03/11/2021 for Dysuria   Pt presents today with complaints of flank pain for three days with onset of dysuria today. Prior history of renal stones. Reports significant increase in calcium intake over last several weeks.   Dysuria  This is a new problem. The current episode started today. The problem occurs every urination. The problem has been unchanged. The quality of the pain is described as burning. The pain is at a severity of 5/10. The pain is moderate. There has been no fever. She is Not sexually active. There is No history of pyelonephritis. Associated symptoms include flank pain. Pertinent negatives include no chills, discharge, frequency, hematuria, hesitancy, nausea, possible pregnancy, sweats, urgency or vomiting. She has tried increased fluids and NSAIDs for the symptoms. The treatment provided mild relief. Her past medical history is significant for kidney stones.   Relevant past medical, surgical, family, and social history reviewed and updated as indicated.  Allergies and medications reviewed and updated. Data reviewed: Chart in Epic.   Past Medical History:  Diagnosis Date   Allergy    GERD (gastroesophageal reflux disease)    History of kidney stones    Hyperlipidemia    Seasonal allergies     Past Surgical History:  Procedure Laterality Date   BREAST BIOPSY Left 03/2016   benign   LUMBAR SPINE SURGERY  2009   MOHS SURGERY  09/2013   nose   RADIOACTIVE SEED GUIDED EXCISIONAL BREAST BIOPSY Left 03/31/2016   Procedure: RADIOACTIVE SEED GUIDED EXCISIONAL LEFT BREAST BIOPSY;  Surgeon: [name], MD;  Location: [facility];  Service: General;  Laterality: Left;   TOTAL ABDOMINAL HYSTERECTOMY   1997   WRIST FRACTURE SURGERY Right 2012    Social History   Socioeconomic History   Marital status: Married    Spouse name: Not on file   Number of children: 0   Years of education: 12th grade   Highest education level: Not on file  Occupational History   Occupation: office work    Fish farm manager: [employer]  Tobacco Use   Smoking status: Never   Smokeless tobacco: Never  Vaping Use   Vaping Use: Never used  Substance and Sexual Activity   Alcohol use: No   Drug use: No   Sexual activity: Yes    Birth control/protection: Surgical  Other Topics Concern   Not on file  Social History Narrative   Not on file   Social Determinants of Health   Financial Resource Strain: Not on file  Food Insecurity: Not on file  Transportation Needs: Not on file  Physical Activity: Not on file  Stress: Not on file  Social Connections: Not on file  Intimate Partner Violence: Not on file    Outpatient Encounter Medications as of 03/11/2021  Medication Sig   alfuzosin (UROXATRAL) 10 MG 24 hr tablet Take 1 tablet (10 mg total) by mouth daily with breakfast for 10 days. Stop once stone passes.   Ascorbic Acid (VITAMIN C) 1000 MG tablet Take 1,000 mg by mouth daily.   atorvastatin (LIPITOR) 10 MG tablet Take 1 tablet (10 mg total) by mouth daily.   azelastine (ASTELIN) 0.1 % nasal spray Place 1  spray into both nostrils 2 (two) times daily.   famotidine (PEPCID) 20 MG tablet TAKE ONE TABLET DAILY AT BEDTIME   Fexofenadine HCl (ALLEGRA PO) Take by mouth.   LORazepam (ATIVAN) 0.5 MG tablet Take 1 tablet (0.5 mg total) by mouth daily as needed for anxiety (put on file).   montelukast (SINGULAIR) 10 MG tablet Take 1 tablet (10 mg total) by mouth daily.   Omega-3 Fatty Acids (FISH OIL) 1000 MG CAPS Take by mouth daily.   omeprazole (PRILOSEC) 20 MG capsule Take 1 capsule (20 mg total) by mouth daily.   [EXPIRED] ketorolac (TORADOL) injection 60 mg    No facility-administered encounter medications on  file as of 03/11/2021.    Allergies  Allergen Reactions   Fluocinolone Rash   Latex Rash   Penicillins Rash   Sulfa Antibiotics Rash   Ciprofloxacin Rash    Review of Systems  Constitutional:  Negative for activity change, appetite change, chills, diaphoresis, fatigue, fever and unexpected weight change.  Eyes:  Negative for photophobia and visual disturbance.  Respiratory:  Negative for cough and shortness of breath.   Cardiovascular:  Negative for chest pain, palpitations and leg swelling.  Gastrointestinal:  Positive for abdominal pain. Negative for abdominal distention, anal bleeding, blood in stool, constipation, diarrhea, nausea, rectal pain and vomiting.  Genitourinary:  Positive for dysuria and flank pain. Negative for decreased urine volume, difficulty urinating, dyspareunia, enuresis, frequency, genital sores, hematuria, hesitancy, menstrual problem, pelvic pain, urgency, vaginal bleeding, vaginal discharge and vaginal pain.  Musculoskeletal:  Positive for back pain. Negative for arthralgias, gait problem, joint swelling, myalgias, neck pain and neck stiffness.  Neurological:  Negative for weakness and headaches.  Psychiatric/Behavioral:  Negative for confusion.   All other systems reviewed and are negative.      Objective:  BP 128/66    Pulse 89    Temp 98.4 F (36.9 C) (Temporal)    Ht 5\' 7"  (1.702 m)    Wt 178 lb 8 oz (81 kg)    BMI 27.96 kg/m    Wt Readings from Last 3 Encounters:  03/11/21 178 lb 8 oz (81 kg)  11/23/20 174 lb 12.8 oz (79.3 kg)  05/20/20 173 lb 3.2 oz (78.6 kg)    Physical Exam Vitals and nursing note reviewed.  Constitutional:      General: She is in acute distress.     Appearance: Normal appearance. She is well-developed and well-groomed. She is obese. She is not ill-appearing, toxic-appearing or diaphoretic.  HENT:     Head: Normocephalic and atraumatic.     Jaw: There is normal jaw occlusion.     Right Ear: Hearing normal.     Left Ear:  Hearing normal.     Nose: Nose normal.     Mouth/Throat:     Lips: Pink.     Mouth: Mucous membranes are moist.     Pharynx: Oropharynx is clear. Uvula midline.  Eyes:     General: Lids are normal.     Extraocular Movements: Extraocular movements intact.     Conjunctiva/sclera: Conjunctivae normal.     Pupils: Pupils are equal, round, and reactive to light.  Neck:     Thyroid: No thyroid mass, thyromegaly or thyroid tenderness.     Vascular: No carotid bruit or JVD.     Trachea: Trachea and phonation normal.  Cardiovascular:     Rate and Rhythm: Normal rate and regular rhythm.     Chest Wall: PMI is not displaced.  Pulses: Normal pulses.     Heart sounds: Normal heart sounds. No murmur heard.   No friction rub. No gallop.  Pulmonary:     Effort: Pulmonary effort is normal. No respiratory distress.     Breath sounds: Normal breath sounds. No wheezing.  Abdominal:     General: Bowel sounds are normal. There is no distension or abdominal bruit.     Palpations: Abdomen is soft. There is no hepatomegaly, splenomegaly or mass.     Tenderness: There is no abdominal tenderness. There is no right CVA tenderness, left CVA tenderness, guarding or rebound.     Hernia: No hernia is present.  Musculoskeletal:        General: Normal range of motion.     Cervical back: Normal range of motion and neck supple.     Right lower leg: No edema.     Left lower leg: No edema.  Lymphadenopathy:     Cervical: No cervical adenopathy.  Skin:    General: Skin is warm and dry.     Capillary Refill: Capillary refill takes less than 2 seconds.     Coloration: Skin is not cyanotic, jaundiced or pale.     Findings: No rash.  Neurological:     General: No focal deficit present.     Mental Status: She is alert and oriented to person, place, and time.     Sensory: Sensation is intact.     Motor: Motor function is intact.     Coordination: Coordination is intact.     Gait: Gait is intact.     Deep  Tendon Reflexes: Reflexes are normal and symmetric.  Psychiatric:        Attention and Perception: Attention and perception normal.        Mood and Affect: Mood and affect normal.        Speech: Speech normal.        Behavior: Behavior normal. Behavior is cooperative.        Thought Content: Thought content normal.        Cognition and Memory: Cognition and memory normal.        Judgment: Judgment normal.    Results for orders placed or performed in visit on 03/11/21  Microscopic Examination   Urine  Result Value Ref Range   WBC, UA 0-5 0 - 5 /hpf   RBC >30 (A) 0 - 2 /hpf   Epithelial Cells (non renal) 0-10 0 - 10 /hpf   Bacteria, UA Few (A) None seen/Few  Urinalysis, Routine w reflex microscopic  Result Value Ref Range   Specific Gravity, UA 1.015 1.005 - 1.030   pH, UA 6.0 5.0 - 7.5   Color, UA Yellow Yellow   Appearance Ur Clear Clear   Leukocytes,UA Negative Negative   Protein,UA Negative Negative/Trace   Glucose, UA Negative Negative   Ketones, UA Negative Negative   RBC, UA 2+ (A) Negative   Bilirubin, UA Negative Negative   Urobilinogen, Ur 0.2 0.2 - 1.0 mg/dL   Nitrite, UA Negative Negative   Microscopic Examination See below:      Urinalysis in office with 2+ blood, otherwise unremarkable.  X-Ray: KUB: possible stones noted, one on right, at least 2 on left. Preliminary x-ray reading by Monia Pouch, FNP-C, [facility].   Pertinent labs & imaging results that were available during my care of the patient were reviewed by me and considered in my medical decision making.  Assessment & Plan:  Kaitlin was seen today for  dysuria.  Diagnoses and all orders for this visit:  Flank pain Dysuria Other microscopic hematuria History of renal stone Renal calculus or stone KUB with probable bilateral nephrolithiasis. Urinalysis negative for infection but positive for blood. No CVA tenderness. No signs of systemic illness. Allergic to sulfa so will treat with Alfuzosin instead of  Flomax. Pt aware to increase water intake. Motrin 600 mg every 8 hours starting tomorrow for pain. Dosed with toradol in office. Referral to urology placed. Pt aware of red flags which require emergent evaluation.  -     Urinalysis, Routine w reflex microscopic -     DG Abd 1 View -     alfuzosin (UROXATRAL) 10 MG 24 hr tablet; Take 1 tablet (10 mg total) by mouth daily with breakfast for 10 days. Stop once stone passes. -     Ambulatory referral to Urology -     ketorolac (TORADOL) injection 60 mg -     Microscopic Examination     Continue all other maintenance medications.  Follow up plan: Return if symptoms worsen or fail to improve.   Continue healthy lifestyle choices, including diet (rich in fruits, vegetables, and lean proteins, and low in salt and simple carbohydrates) and exercise (at least 30 minutes of moderate physical activity daily).  Educational handout given for renal stones.   The above assessment and management plan was discussed with the patient. The patient verbalized understanding of and has agreed to the management plan. Patient is aware to call the clinic if they develop any new symptoms or if symptoms persist or worsen. Patient is aware when to return to the clinic for a follow-up visit. Patient educated on when it is appropriate to go to the emergency department.   Monia Pouch, FNP-C [facility] [phone]

## 2021-03-12 ENCOUNTER — Ambulatory Visit: Payer: BC Managed Care – PPO | Admitting: Nurse Practitioner

## 2021-03-15 DIAGNOSIS — R3129 Other microscopic hematuria: Secondary | ICD-10-CM | POA: Diagnosis not present

## 2021-03-15 DIAGNOSIS — K760 Fatty (change of) liver, not elsewhere classified: Secondary | ICD-10-CM | POA: Diagnosis not present

## 2021-03-15 DIAGNOSIS — N210 Calculus in bladder: Secondary | ICD-10-CM | POA: Diagnosis not present

## 2021-03-15 DIAGNOSIS — N201 Calculus of ureter: Secondary | ICD-10-CM | POA: Diagnosis not present

## 2021-03-15 DIAGNOSIS — R1084 Generalized abdominal pain: Secondary | ICD-10-CM | POA: Diagnosis not present

## 2021-03-15 DIAGNOSIS — Z90710 Acquired absence of both cervix and uterus: Secondary | ICD-10-CM | POA: Diagnosis not present

## 2021-03-15 DIAGNOSIS — R3121 Asymptomatic microscopic hematuria: Secondary | ICD-10-CM | POA: Diagnosis not present

## 2021-03-23 DIAGNOSIS — R1909 Other intra-abdominal and pelvic swelling, mass and lump: Secondary | ICD-10-CM | POA: Diagnosis not present

## 2021-04-02 DIAGNOSIS — R1909 Other intra-abdominal and pelvic swelling, mass and lump: Secondary | ICD-10-CM | POA: Diagnosis not present

## 2021-04-09 DIAGNOSIS — N201 Calculus of ureter: Secondary | ICD-10-CM | POA: Diagnosis not present

## 2021-04-15 ENCOUNTER — Encounter (HOSPITAL_BASED_OUTPATIENT_CLINIC_OR_DEPARTMENT_OTHER): Payer: Self-pay | Admitting: Obstetrics and Gynecology

## 2021-04-15 ENCOUNTER — Other Ambulatory Visit: Payer: Self-pay

## 2021-04-15 ENCOUNTER — Other Ambulatory Visit: Payer: Self-pay | Admitting: Obstetrics and Gynecology

## 2021-04-15 NOTE — Progress Notes (Addendum)
Spoke w/ via phone for pre-op interview---pt  ?Lab needs dos---- n/a            ?Lab results------n/a ?COVID test -----patient states asymptomatic no test needed ?Arrive at -------1115 04/26/2021 ?NPO after MN NO Solid Food.  Clear liquids from MN until---1015 04/26/2021 ?Med rec completed ?Medications to take morning of surgery -----Ativan, singulair, allegra, prilosec ?Diabetic medication -----n/a ?Patient instructed no nail polish to be worn day of surgery ?Patient instructed to bring photo id and insurance card day of surgery ?Patient aware to have Driver (ride ) / husband Force caregiver    for 24 hours after surgery  ?Patient Special Instructions -----none ?Pre-Op special Istructions -----none ?Patient verbalized understanding of instructions that were given at this phone interview. ?Patient denies shortness of breath, chest pain, fever, cough at this phone interview.  ?

## 2021-04-16 ENCOUNTER — Other Ambulatory Visit: Payer: Self-pay | Admitting: Family Medicine

## 2021-04-16 DIAGNOSIS — K219 Gastro-esophageal reflux disease without esophagitis: Secondary | ICD-10-CM

## 2021-04-23 ENCOUNTER — Ambulatory Visit
Admission: RE | Admit: 2021-04-23 | Discharge: 2021-04-23 | Disposition: A | Discharge status: Home / Self Care | Payer: BC Managed Care – PPO | Source: Ambulatory Visit | Attending: Obstetrics and Gynecology | Admitting: Obstetrics and Gynecology

## 2021-04-23 ENCOUNTER — Other Ambulatory Visit: Payer: Self-pay | Admitting: Obstetrics and Gynecology

## 2021-04-23 ENCOUNTER — Encounter (HOSPITAL_COMMUNITY): Payer: BC Managed Care – PPO

## 2021-04-23 DIAGNOSIS — R922 Inconclusive mammogram: Secondary | ICD-10-CM | POA: Diagnosis not present

## 2021-04-23 DIAGNOSIS — R928 Other abnormal and inconclusive findings on diagnostic imaging of breast: Secondary | ICD-10-CM

## 2021-04-23 DIAGNOSIS — R921 Mammographic calcification found on diagnostic imaging of breast: Secondary | ICD-10-CM

## 2021-04-23 DIAGNOSIS — N6011 Diffuse cystic mastopathy of right breast: Secondary | ICD-10-CM | POA: Diagnosis not present

## 2021-04-26 ENCOUNTER — Encounter (HOSPITAL_BASED_OUTPATIENT_CLINIC_OR_DEPARTMENT_OTHER)
Admission: RE | Disposition: A | Discharge status: Home / Self Care | Payer: Self-pay | Source: Home / Self Care | Attending: Obstetrics and Gynecology

## 2021-04-26 ENCOUNTER — Other Ambulatory Visit: Payer: Self-pay

## 2021-04-26 ENCOUNTER — Encounter (HOSPITAL_BASED_OUTPATIENT_CLINIC_OR_DEPARTMENT_OTHER): Payer: Self-pay | Admitting: Obstetrics and Gynecology

## 2021-04-26 ENCOUNTER — Ambulatory Visit (HOSPITAL_BASED_OUTPATIENT_CLINIC_OR_DEPARTMENT_OTHER)
Admission: RE | Admit: 2021-04-26 | Discharge: 2021-04-26 | Disposition: A | Discharge status: Home / Self Care | Payer: BC Managed Care – PPO | Attending: Obstetrics and Gynecology | Admitting: Obstetrics and Gynecology

## 2021-04-26 ENCOUNTER — Ambulatory Visit (HOSPITAL_BASED_OUTPATIENT_CLINIC_OR_DEPARTMENT_OTHER): Payer: BC Managed Care – PPO | Admitting: Anesthesiology

## 2021-04-26 DIAGNOSIS — K219 Gastro-esophageal reflux disease without esophagitis: Secondary | ICD-10-CM | POA: Diagnosis not present

## 2021-04-26 DIAGNOSIS — R1903 Right lower quadrant abdominal swelling, mass and lump: Secondary | ICD-10-CM | POA: Diagnosis not present

## 2021-04-26 DIAGNOSIS — N839 Noninflammatory disorder of ovary, fallopian tube and broad ligament, unspecified: Secondary | ICD-10-CM | POA: Insufficient documentation

## 2021-04-26 DIAGNOSIS — Z90710 Acquired absence of both cervix and uterus: Secondary | ICD-10-CM | POA: Insufficient documentation

## 2021-04-26 DIAGNOSIS — D270 Benign neoplasm of right ovary: Secondary | ICD-10-CM | POA: Diagnosis not present

## 2021-04-26 DIAGNOSIS — R8769 Abnormal cytological findings in specimens from other female genital organs: Secondary | ICD-10-CM | POA: Diagnosis not present

## 2021-04-26 DIAGNOSIS — N838 Other noninflammatory disorders of ovary, fallopian tube and broad ligament: Secondary | ICD-10-CM | POA: Diagnosis not present

## 2021-04-26 HISTORY — PX: LAPAROSCOPIC SALPINGO OOPHERECTOMY: SHX5927

## 2021-04-26 LAB — BASIC METABOLIC PANEL
Anion gap: 7 (ref 5–15)
BUN: 9 mg/dL (ref 6–20)
CO2: 26 mmol/L (ref 22–32)
Calcium: 8.9 mg/dL (ref 8.9–10.3)
Chloride: 110 mmol/L (ref 98–111)
Creatinine, Ser: 0.36 mg/dL — ABNORMAL LOW (ref 0.44–1.00)
GFR, Estimated: >60 mL/min (ref >60)
Glucose, Bld: 126 mg/dL — ABNORMAL HIGH (ref 70–99)
Potassium: 2.9 mmol/L — ABNORMAL LOW (ref 3.5–5.1)
Sodium: 143 mmol/L (ref 135–145)

## 2021-04-26 LAB — TYPE AND SCREEN
ABO/RH(D): A NEG
Antibody Screen: NEGATIVE

## 2021-04-26 LAB — CBC
HCT: 39.7 % (ref 36.0–46.0)
Hemoglobin: 13.9 g/dL (ref 12.0–15.0)
MCH: 31.2 pg (ref 26.0–34.0)
MCHC: 35 g/dL (ref 30.0–36.0)
MCV: 89.2 fL (ref 80.0–100.0)
Platelets: 211 10*3/uL (ref 150–400)
RBC: 4.45 MIL/uL (ref 3.87–5.11)
RDW: 13.2 % (ref 11.5–15.5)
WBC: 9.5 10*3/uL (ref 4.0–10.5)
nRBC: 0 % (ref 0.0–0.2)

## 2021-04-26 LAB — ABO/RH: ABO/RH(D): A NEG

## 2021-04-26 SURGERY — SALPINGO-OOPHORECTOMY, LAPAROSCOPIC
Anesthesia: General | Site: Abdomen | Laterality: Right

## 2021-04-26 MED ORDER — OXYCODONE HCL 5 MG PO TABS: 5.0000 mg | ORAL_TABLET | Freq: Four times a day (QID) | ORAL | 0 refills | Status: DC | PRN

## 2021-04-26 MED ORDER — ACETAMINOPHEN 500 MG PO TABS
ORAL_TABLET | ORAL | Status: AC
  Filled 2021-04-26: qty 2

## 2021-04-26 MED ORDER — LIDOCAINE HCL (PF) 2 % IJ SOLN
INTRAMUSCULAR | Status: AC
  Filled 2021-04-26: qty 5

## 2021-04-26 MED ORDER — KETOROLAC TROMETHAMINE 30 MG/ML IJ SOLN
INTRAMUSCULAR | Status: DC | PRN
  Administered 2021-04-26: 30 mg via INTRAVENOUS

## 2021-04-26 MED ORDER — MIDAZOLAM HCL 2 MG/2ML IJ SOLN
INTRAMUSCULAR | Status: DC | PRN
  Administered 2021-04-26: 2 mg via INTRAVENOUS

## 2021-04-26 MED ORDER — FENTANYL CITRATE (PF) 100 MCG/2ML IJ SOLN
INTRAMUSCULAR | Status: AC
  Filled 2021-04-26: qty 2

## 2021-04-26 MED ORDER — ROCURONIUM BROMIDE 10 MG/ML (PF) SYRINGE
PREFILLED_SYRINGE | INTRAVENOUS | Status: DC | PRN
  Administered 2021-04-26: 50 mg via INTRAVENOUS
  Administered 2021-04-26 (×2): 5 mg via INTRAVENOUS

## 2021-04-26 MED ORDER — 0.9 % SODIUM CHLORIDE (POUR BTL) OPTIME
TOPICAL | Status: DC | PRN
  Administered 2021-04-26: 500 mL

## 2021-04-26 MED ORDER — FENTANYL CITRATE (PF) 100 MCG/2ML IJ SOLN
INTRAMUSCULAR | Status: DC | PRN
Start: 2021-04-26 — End: 2021-04-26
  Administered 2021-04-26 (×3): 50 ug via INTRAVENOUS

## 2021-04-26 MED ORDER — DEXAMETHASONE SODIUM PHOSPHATE 10 MG/ML IJ SOLN
INTRAMUSCULAR | Status: DC | PRN
Start: 2021-04-26 — End: 2021-04-26
  Administered 2021-04-26: 10 mg via INTRAVENOUS

## 2021-04-26 MED ORDER — PROPOFOL 10 MG/ML IV BOLUS
INTRAVENOUS | Status: AC
Start: 2021-04-26 — End: ?
  Filled 2021-04-26: qty 20

## 2021-04-26 MED ORDER — DEXAMETHASONE SODIUM PHOSPHATE 10 MG/ML IJ SOLN
INTRAMUSCULAR | Status: AC
  Filled 2021-04-26: qty 1

## 2021-04-26 MED ORDER — ROCURONIUM BROMIDE 10 MG/ML (PF) SYRINGE
PREFILLED_SYRINGE | INTRAVENOUS | Status: AC
  Filled 2021-04-26: qty 10

## 2021-04-26 MED ORDER — HYDROMORPHONE HCL 1 MG/ML IJ SOLN: 0.2500 mg | INTRAMUSCULAR | Status: DC | PRN

## 2021-04-26 MED ORDER — ACETAMINOPHEN 500 MG PO TABS
1000.0000 mg | ORAL_TABLET | Freq: Once | ORAL | Status: AC
  Administered 2021-04-26: 1000 mg via ORAL

## 2021-04-26 MED ORDER — POVIDONE-IODINE 10 % EX SWAB: 2.0000 "application " | Freq: Once | CUTANEOUS | Status: DC

## 2021-04-26 MED ORDER — LACTATED RINGERS IV SOLN: INTRAVENOUS | Status: DC

## 2021-04-26 MED ORDER — BUPIVACAINE HCL (PF) 0.25 % IJ SOLN
INTRAMUSCULAR | Status: DC | PRN
  Administered 2021-04-26: 10 mL

## 2021-04-26 MED ORDER — OXYCODONE HCL 5 MG PO TABS
ORAL_TABLET | ORAL | Status: AC
  Filled 2021-04-26: qty 1

## 2021-04-26 MED ORDER — ONDANSETRON HCL 4 MG/2ML IJ SOLN
INTRAMUSCULAR | Status: DC | PRN
  Administered 2021-04-26: 4 mg via INTRAVENOUS

## 2021-04-26 MED ORDER — MIDAZOLAM HCL 2 MG/2ML IJ SOLN
INTRAMUSCULAR | Status: AC
  Filled 2021-04-26: qty 2

## 2021-04-26 MED ORDER — SODIUM CHLORIDE 0.9 % IR SOLN
Status: DC | PRN
  Administered 2021-04-26: 500 mL

## 2021-04-26 MED ORDER — OXYCODONE HCL 5 MG PO TABS
5.0000 mg | ORAL_TABLET | Freq: Once | ORAL | Status: AC
  Administered 2021-04-26: 5 mg via ORAL

## 2021-04-26 MED ORDER — PROPOFOL 10 MG/ML IV BOLUS
INTRAVENOUS | Status: DC | PRN
  Administered 2021-04-26: 150 mg via INTRAVENOUS

## 2021-04-26 MED ORDER — ONDANSETRON HCL 4 MG/2ML IJ SOLN
INTRAMUSCULAR | Status: AC
  Filled 2021-04-26: qty 2

## 2021-04-26 MED ORDER — PHENYLEPHRINE 40 MCG/ML (10ML) SYRINGE FOR IV PUSH (FOR BLOOD PRESSURE SUPPORT)
PREFILLED_SYRINGE | INTRAVENOUS | Status: AC
Start: 2021-04-26 — End: ?
  Filled 2021-04-26: qty 10

## 2021-04-26 MED ORDER — PHENYLEPHRINE 40 MCG/ML (10ML) SYRINGE FOR IV PUSH (FOR BLOOD PRESSURE SUPPORT)
PREFILLED_SYRINGE | INTRAVENOUS | Status: DC | PRN
  Administered 2021-04-26: 80 ug via INTRAVENOUS

## 2021-04-26 MED ORDER — LIDOCAINE 2% (20 MG/ML) 5 ML SYRINGE
INTRAMUSCULAR | Status: DC | PRN
Start: 2021-04-26 — End: 2021-04-26
  Administered 2021-04-26: 60 mg via INTRAVENOUS

## 2021-04-26 MED ORDER — KETOROLAC TROMETHAMINE 30 MG/ML IJ SOLN
INTRAMUSCULAR | Status: AC
Start: 2021-04-26 — End: ?
  Filled 2021-04-26: qty 1

## 2021-04-26 MED ORDER — IBUPROFEN 800 MG PO TABS: 800.0000 mg | ORAL_TABLET | Freq: Three times a day (TID) | ORAL | 0 refills | Status: DC | PRN

## 2021-04-26 SURGICAL SUPPLY — 38 items
ADH SKN CLS APL DERMABOND .7 (GAUZE/BANDAGES/DRESSINGS) ×1
BAG RETRIEVAL 10 (BASKET) ×1
CABLE HIGH FREQUENCY MONO STRZ (ELECTRODE) IMPLANT
DERMABOND ADVANCED (GAUZE/BANDAGES/DRESSINGS) ×1
DERMABOND ADVANCED .7 DNX12 (GAUZE/BANDAGES/DRESSINGS) ×1 IMPLANT
DRSG OPSITE POSTOP 3X4 (GAUZE/BANDAGES/DRESSINGS) ×1 IMPLANT
DURAPREP 26ML APPLICATOR (WOUND CARE) ×2 IMPLANT
GAUZE 4X4 16PLY ~~LOC~~+RFID DBL (SPONGE) ×3 IMPLANT
GLOVE BIOGEL PI IND STRL 7.0 (GLOVE) ×4 IMPLANT
GLOVE BIOGEL PI INDICATOR 7.0 (GLOVE) ×4
GLOVE ECLIPSE 6.5 STRL STRAW (GLOVE) ×2 IMPLANT
GOWN STRL REUS W/TWL LRG LVL3 (GOWN DISPOSABLE) ×8 IMPLANT
IV NS 1000ML (IV SOLUTION) ×2
IV NS 1000ML BAXH (IV SOLUTION) IMPLANT
KIT TURNOVER CYSTO (KITS) ×2 IMPLANT
LIGASURE VESSEL 5MM BLUNT TIP (ELECTROSURGICAL) ×1 IMPLANT
MANIPULATOR UTERINE 4.5 ZUMI (MISCELLANEOUS) IMPLANT
NEEDLE INSUFFLATION 120MM (ENDOMECHANICALS) ×2 IMPLANT
NS IRRIG 500ML POUR BTL (IV SOLUTION) ×1 IMPLANT
PACK LAPAROSCOPY BASIN (CUSTOM PROCEDURE TRAY) ×2 IMPLANT
PACK TRENDGUARD 450 HYBRID PRO (MISCELLANEOUS) IMPLANT
PAD OB MATERNITY 4.3X12.25 (PERSONAL CARE ITEMS) ×1 IMPLANT
PROTECTOR NERVE ULNAR (MISCELLANEOUS) ×1 IMPLANT
SCISSORS LAP 5X35 DISP (ENDOMECHANICALS) IMPLANT
SET IRRIG TUBING LAPAROSCOPIC (IRRIGATION / IRRIGATOR) ×1 IMPLANT
SET TUBE SMOKE EVAC HIGH FLOW (TUBING) ×2 IMPLANT
SUT VICRYL 0 UR6 27IN ABS (SUTURE) ×4 IMPLANT
SUT VICRYL 4-0 PS2 18IN ABS (SUTURE) ×2 IMPLANT
SYR 5ML LL (SYRINGE) ×1 IMPLANT
SYS BAG RETRIEVAL 10MM (BASKET) ×1
SYSTEM BAG RETRIEVAL 10MM (BASKET) IMPLANT
SYSTEM CARTER THOMASON II (TROCAR) ×1 IMPLANT
TOWEL OR 17X26 10 PK STRL BLUE (TOWEL DISPOSABLE) ×3 IMPLANT
TRAY FOLEY W/BAG SLVR 14FR LF (SET/KITS/TRAYS/PACK) ×2 IMPLANT
TRENDGUARD 450 HYBRID PRO PACK (MISCELLANEOUS) ×2
TROCAR XCEL NON-BLD 11X100MML (ENDOMECHANICALS) ×1 IMPLANT
TROCAR XCEL NON-BLD 5MMX100MML (ENDOMECHANICALS) ×4 IMPLANT
WARMER LAPAROSCOPE (MISCELLANEOUS) ×2 IMPLANT

## 2021-04-26 NOTE — Op Note (Signed)
Summer Brady ?[birth year] ?[id number] ? ?Operative Note ? ?PROCEDURE: laparoscopic right salpingo-oophorectomy ? ?PRE-OPERATIVE DIAGNOSIS: right adnexal mass ? ?POST-OPERATIVE DIAGNOSIS: right ovarian mass ? ?SURGEON: Dr. Langley Gauss, DO ? ?ASSISTANT: Dr. Tiana Loft, MD ? ?FINDINGS: Laparoscopic general survey grossly normal findings. Female pelvic anatomy reveals surgically absent uterus. Enlarged right ovary with solid mass approximately 4-5 cm in size and fallopian tube remnant. Normal appearing left ovary. ? ?SPECIMENS: right ovary partial tube, pelvic washings ? ?EBL: 10 cc ? ?FLUIDS: 1,200 cc crystalloids ? ?COMPLICATIONS: None ? ?PROCEDURE IN DETAIL:  ? ?After the patient was appropriately consented in the holding area, she was taken to the operating room where general anesthesia was administered without complications. The patient was placed in the dorsal lithotomy position. Bilateral arms were tucked with the appropriate barrier padding. The patient was prepped and draped in the usual sterile fashion. The bladder was trained with a catheter. An appropriate time out was performed that verified the correct patient, procedure, and surgical team.  ? ?A scalpel was used to make a small transverse skin incision just inferior to the umbilicus. The Veress needle was advanced through the skin incision until 2 clicks were appreciated. The gas was connected and turned on and a low opening pressure was noted. Pneumoperitoneum was achieved without complications. A 5 mm laparoscope and trocar sleeve was inserted through the umbilical incision and the abdomen was entered under direct visualization. Inspection of the abdomen revealed the findings as noted above and no obvious injuries noted. The patient was placed in Trendelenburg positioning to facilitate pelvic visualization. Lower quadrant ports were placed bilaterally at points approximately 2 cm superior and 2 cm medial to the ASIS. A 10 mm trocar was placed in the  left lower quadrant and a 5 mm placed in the right lower quadrant under laparoscopic visualization. Pelvic washings were collected and sent for cytology. The LigaSure device was used to secure the right IP ligament. The LigaSure was used to sequentially cauterize and cut the mesosalpinx until the fallopian tube remnant and ovary were freed from the pelvic side wall. The specimens were removed intact through a laparoscopic specimen retrieval bag through the 10 mm left port site. The specimen bag was brought up through the skin incision and the ovarian mass was removed in pieces by morcellation within the bag. All specimens were sent for final pathology. A final inspection of the surgical site and ovarian pedicle revealed excellent hemostasis. The fascia was closed using the fascia closure device and an 0-Vicryl suture at the 10 mm left port site. The pressure was reduced and the patient laid flat and continued hemostasis noted. The gas was released from the abdomen. The skin incisions were closed with 4-0 Vicryl and skin glue. Local anesthetic using 0.25% Marcaine had been injected at the incision sites. The Foley catheter was removed. The patient tolerated the procedure well and was taken to the recovery room in stable condition. All lap, needle, and instrument counts were correct. ? ?Summer Brady ?04/26/21 ?8:57 PM ? ? ?

## 2021-04-26 NOTE — Brief Op Note (Signed)
04/26/2021 ? ?3:04 PM ? ?PATIENT:  ALEXIANA LAVERDURE  56 y.o. female ? ?PRE-OPERATIVE DIAGNOSIS:  Right Adnexal Mass ? ?POST-OPERATIVE DIAGNOSIS:  Right Adnexal Mass ? ?PROCEDURE:  Procedure(s): ?LAPAROSCOPIC OOPHORECTOMY; PARTIAL RIGHT SALPINGECTOMY (Right) ? ?SURGEON:  Surgeon(s) and Role: ?   * Bennetta Rudden A, DO - Primary ?   Murrell Redden Earlyne Iba, MD - Assisting ? ?PHYSICIAN ASSISTANT:  ? ?ANESTHESIA:   general ? ?EBL:  10 mL  ? ?BLOOD ADMINISTERED:none ? ?DRAINS: Urinary Catheter (Foley)  ? ?LOCAL MEDICATIONS USED:  MARCAINE    ? ?SPECIMEN:  Source of Specimen:  right ovary and partal fallopian tube ? ?DISPOSITION OF SPECIMEN:  PATHOLOGY ? ?COUNTS:  YES ? ?TOURNIQUET:  * No tourniquets in log * ? ?DICTATION: .Note written in EPIC ? ?PLAN OF CARE: Discharge to home after PACU ? ?PATIENT DISPOSITION:  PACU - hemodynamically stable. ?  ?Delay start of Pharmacological VTE agent (>24hrs) due to surgical blood loss or risk of bleeding: no ? ?

## 2021-04-26 NOTE — Transfer of Care (Signed)
Immediate Anesthesia Transfer of Care Note ? ?Patient: Summer Brady ? ?Procedure(s) Performed: Procedure(s) (LRB): ?LAPAROSCOPIC OOPHORECTOMY; PARTIAL RIGHT SALPINGECTOMY (Right) ? ?Patient Location: PACU ? ?Anesthesia Type: General ? ?Level of Consciousness: awake, alert  and oriented ? ?Airway & Oxygen Therapy: Patient Spontanous Breathing  ? ?Post-op Assessment: Report given to PACU RN and Post -op Vital signs reviewed and stable ? ?Post vital signs: Reviewed and stable ? ?Complications: No apparent anesthesia complications ? ?Last Vitals:  ?Vitals Value Taken Time  ?BP 127/68 04/26/21 1517  ?Temp    ?Pulse 97 04/26/21 1523  ?Resp 18 04/26/21 1523  ?SpO2 95 % 04/26/21 1523  ?Vitals shown include unvalidated device data. ? ?Last Pain:  ?Vitals:  ? 04/26/21 1124  ?TempSrc: Oral  ?PainSc: 0-No pain  ?   ? ?Patients Stated Pain Goal: 7 (04/26/21 1124) ? ?Complications: No notable events documented. ?

## 2021-04-26 NOTE — Anesthesia Preprocedure Evaluation (Addendum)
Anesthesia Evaluation  ?Patient identified by MRN, date of birth, ID band ?Patient awake ? ? ? ?Reviewed: ?Allergy & Precautions, H&P , NPO status , Patient's Chart, lab work & pertinent test results ? ?Airway ?Mallampati: III ? ?TM Distance: >3 FB ?Neck ROM: Full ? ? ? Dental ?no notable dental hx. ?(+) Teeth Intact, Dental Advisory Given ?  ?Pulmonary ?neg pulmonary ROS,  ?  ?Pulmonary exam normal ?breath sounds clear to auscultation ? ? ? ? ? ? Cardiovascular ?negative cardio ROS ? ? ?Rhythm:Regular Rate:Normal ? ? ?  ?Neuro/Psych ?Anxiety negative neurological ROS ?   ? GI/Hepatic ?Neg liver ROS, GERD  Medicated,  ?Endo/Other  ?negative endocrine ROS ? Renal/GU ?negative Renal ROS  ?negative genitourinary ?  ?Musculoskeletal ? ? Abdominal ?  ?Peds ? Hematology ?negative hematology ROS ?(+)   ?Anesthesia Other Findings ? ? Reproductive/Obstetrics ?negative OB ROS ? ?  ? ? ? ? ? ? ? ? ? ? ? ? ? ?  ?  ? ? ? ? ? ? ? ?Anesthesia Physical ?Anesthesia Plan ? ?ASA: 2 ? ?Anesthesia Plan: General  ? ?Post-op Pain Management: Tylenol PO (pre-op)* and Toradol IV (intra-op)*  ? ?Induction: Intravenous ? ?PONV Risk Score and Plan: 4 or greater and Ondansetron, Dexamethasone and Midazolam ? ?Airway Management Planned: Oral ETT ? ?Additional Equipment:  ? ?Intra-op Plan:  ? ?Post-operative Plan: Extubation in OR ? ?Informed Consent: I have reviewed the patients History and Physical, chart, labs and discussed the procedure including the risks, benefits and alternatives for the proposed anesthesia with the patient or authorized representative who has indicated his/her understanding and acceptance.  ? ? ? ?Dental advisory given ? ?Plan Discussed with: CRNA ? ?Anesthesia Plan Comments:   ? ? ? ? ? ? ?Anesthesia Quick Evaluation ? ?

## 2021-04-26 NOTE — Anesthesia Procedure Notes (Signed)
Procedure Name: Intubation ?Date/Time: 04/26/2021 1:22 PM ?Performed by: Mechele Claude, CRNA ?Pre-anesthesia Checklist: Patient identified, Emergency Drugs available, Suction available and Patient being monitored ?Patient Re-evaluated:Patient Re-evaluated prior to induction ?Oxygen Delivery Method: Circle system utilized ?Preoxygenation: Pre-oxygenation with 100% oxygen ?Induction Type: IV induction ?Ventilation: Mask ventilation without difficulty ?Laryngoscope Size: Mac and 3 ?Grade View: Grade I ?Tube type: Oral ?Tube size: 7.0 mm ?Number of attempts: 1 ?Airway Equipment and Method: Stylet and Oral airway ?Placement Confirmation: ETT inserted through vocal cords under direct vision, positive ETCO2 and breath sounds checked- equal and bilateral ?Secured at: 21 cm ?Tube secured with: Tape ?Dental Injury: Teeth and Oropharynx as per pre-operative assessment  ? ? ? ? ?

## 2021-04-26 NOTE — Discharge Instructions (Addendum)
DISCHARGE INSTRUCTIONS: Laparoscopy ? ?The following instructions have been prepared to help you care for yourself upon your return home today. ? ?Wound care: ? Do not get the incision wet for the first 24 hours. The incision should be kept clean and dry. ? The Band-Aids or dressings may be removed the day after surgery. ? Should the incision become sore, red, and swollen after the first week, check with your doctor. ? ?Personal hygiene: ? Shower the day after your procedure. ? ?Activity and limitations: ? Do NOT drive or operate any equipment today. ? Do NOT lift anything more than 15 pounds for 2-3 weeks after surgery. ? Do NOT rest in bed all day. ? Walking is encouraged. Walk each day, starting slowly with 5-minute walks 3 or 4 times a day. Slowly increase the length of your walks. ? Walk up and down stairs slowly. ? Do NOT do strenuous activities, such as golfing, playing tennis, bowling, running, biking, weight lifting, gardening, mowing, or vacuuming for 2-4 weeks. Ask your doctor when it is okay to start. ? ?Diet: Eat a light meal as desired this evening. You may resume your usual diet tomorrow. ? ?Return to work: This is dependent on the type of work you do. For the most part you can return to a desk job within a week of surgery. If you are more active at work, please discuss this with your doctor. ? ?What to expect after your surgery: You may have a slight burning sensation when you urinate on the first day. You may have a very small amount of blood in the urine. Expect to have a small amount of vaginal discharge/light bleeding for 1-2 weeks. It is not unusual to have abdominal soreness and bruising for up to 2 weeks. You may be tired and need more rest for about 1 week. You may experience shoulder pain for 24-72 hours. Lying flat in bed may relieve it. ? ?Call your doctor for any of the following: ? Develop a fever of 100.4 or greater ? Inability to urinate 6 hours after discharge from hospital ? Severe  pain not relieved by pain medications ? Persistent of heavy bleeding at incision site ? Redness or swelling around incision site after a week ? Increasing nausea or vomiting ? ?Patient Signature________________________________________ ?Nurse Signature_________________________________________  ?Post Anesthesia Home Care Instructions ? ?Activity: ?Get plenty of rest for the remainder of the day. A responsible adult should stay with you for 24 hours following the procedure.  ?For the next 24 hours, DO NOT: ?-Drive a car ?-Paediatric nurse ?-Drink alcoholic beverages ?-Take any medication unless instructed by your physician ?-Make any legal decisions or sign important papers. ? ?Meals: ?Start with liquid foods such as gelatin or soup. Progress to regular foods as tolerated. Avoid greasy, spicy, heavy foods. If nausea and/or vomiting occur, drink only clear liquids until the nausea and/or vomiting subsides. Call your physician if vomiting continues. ? ?Special Instructions/Symptoms: ?Your throat may feel dry or sore from the anesthesia or the breathing tube placed in your throat during surgery. If this causes discomfort, gargle with warm salt water. The discomfort should disappear within 24 hours. ? ?If you had a scopolamine patch placed behind your ear for the management of post- operative nausea and/or vomiting: ? ?1. The medication in the patch is effective for 72 hours, after which it should be removed.  Wrap patch in a tissue and discard in the trash. Wash hands thoroughly with soap and water. ?2. You may remove the  patch earlier than 72 hours if you experience unpleasant side effects which may include dry mouth, dizziness or visual disturbances. ?3. Avoid touching the patch. Wash your hands with soap and water after contact with the patch. ? ?May have tylenol today after 6pm  ?   ?

## 2021-04-26 NOTE — H&P (Signed)
Summer Brady is an 56 y.o. female presenting for scheduled surgery with laparoscopic right oophorectomy for right adnexal mass ? ?Patient originally had right adnexal mass identified when she had undergone CT with Urologist for kidney stone hx: ?Findings on the CT with respect to gyn showing a solid 5.1cm right adnexal lesion, nonspecific possibly reflecting a broad ligament fibroid, but solid ovarian lesion not excluded. No pathologically enlarged abdominal or pelvic lymph nodes.  ?03/23/21 TVUS done in office showed: ?Findings on the CT with respect to gyn showing a solid 5.1cm right adnexal lesion, nonspecific possibly reflecting a broad ligament fibroid, but solid ovarian lesion not excluded. No pathologically enlarged abdominal or pelvic lymph nodes.  ?History of TVH in 1997 with operative note records reviewed stating normal ovaries visualized- done for persistent abnormal Paps ? ?Tumor Markers done: ?CA 125: 20.5 ?CEA: 0.8 ?CA 19-9: 9 ? ?PSH: breast lumpectomy benign, back surgery, and TVH ?No known family hx of breast, uterine, or ovarian cancers ?PMH: GERD on omeprazole and famotidine, and HLD on statin. No hx of HTN or DM ? ? ?Menstrual History: ?No LMP recorded. Patient has had a hysterectomy. ?  ? ?Past Medical History:  ?Diagnosis Date  ? Allergy   ? GERD (gastroesophageal reflux disease)   ? History of kidney stones   ? last stone was March 1,2023 per pt  04/15/2021  ? Hyperlipidemia   ? Seasonal allergies   ? ? ?Past Surgical History:  ?Procedure Laterality Date  ? BREAST BIOPSY Left 03/2016  ? benign  ? LUMBAR SPINE SURGERY  2009  ? MOHS SURGERY  09/2013  ? nose  ? RADIOACTIVE SEED GUIDED EXCISIONAL BREAST BIOPSY Left 03/31/2016  ? Procedure: RADIOACTIVE SEED GUIDED EXCISIONAL LEFT BREAST BIOPSY;  Surgeon: [name], MD;  Location: [facility];  Service: General;  Laterality: Left;  ? [place]  ? WRIST FRACTURE SURGERY Right 2012  ? ? ?Family History   ?Problem Relation Age of Onset  ? Hypertension Mother   ? Hyperlipidemia Mother   ? Heart attack Father   ? Hypertension Father   ? Cancer Father   ?     prostate   ? Diabetes Father   ? Hypertension Brother   ? Diabetes Maternal Grandmother   ? Cancer Maternal Grandfather   ?     lung  ? Diabetes Paternal Grandfather   ? Breast cancer Maternal Aunt   ? Colon cancer Neg Hx   ? ? ?Social History:  reports that she has never smoked. She has never used smokeless tobacco. She reports that she does not drink alcohol and does not use drugs. ? ?Allergies:  ?Allergies  ?Allergen Reactions  ? Fluocinolone Rash  ? Latex Rash  ? Penicillins Rash  ? Sulfa Antibiotics Rash  ? Ciprofloxacin Rash  ? ? ?Medications Prior to Admission  ?Medication Sig Dispense Refill Last Dose  ? Ascorbic Acid (VITAMIN C) 1000 MG tablet Take 1,000 mg by mouth daily.   04/25/2021  ? atorvastatin (LIPITOR) 10 MG tablet Take 1 tablet (10 mg total) by mouth daily. 90 tablet 3 04/25/2021  ? cholecalciferol (VITAMIN D3) 25 MCG (1000 UNIT) tablet Take 50 mcg by mouth daily.   04/25/2021  ? Elderberry 500 MG CAPS Take 50 mg by mouth daily.   04/25/2021  ? famotidine (PEPCID) 20 MG tablet TAKE ONE TABLET DAILY AT BEDTIME (Patient taking differently: Take 20 mg by mouth daily.) 90 tablet 3 04/25/2021  ? Fexofenadine HCl (  ALLEGRA PO) Take 1 tablet by mouth daily.   04/25/2021  ? LORazepam (ATIVAN) 0.5 MG tablet Take 1 tablet (0.5 mg total) by mouth daily as needed for anxiety (put on file). 30 tablet 3 Past Month  ? montelukast (SINGULAIR) 10 MG tablet Take 1 tablet (10 mg total) by mouth daily. 90 tablet 3 04/25/2021  ? Omega-3 Fatty Acids (FISH OIL) 1000 MG CAPS Take 1,200 mg by mouth daily.   04/25/2021  ? omeprazole (PRILOSEC) 20 MG capsule TAKE ONE CAPSULE BY MOUTH DAILY 30 capsule 0 04/25/2021  ? ? ?Review of Systems  ?All other systems reviewed and are negative. ? ?Height '5\' 7"'$  (1.702 m), weight 77.1 kg. ?Physical Exam ?Vitals reviewed.  ?Constitutional:   ?    Appearance: Normal appearance.  ?HENT:  ?   Head: Normocephalic.  ?Cardiovascular:  ?   Rate and Rhythm: Normal rate.  ?Pulmonary:  ?   Effort: Pulmonary effort is normal.  ?Abdominal:  ?   General: Abdomen is flat.  ?   Palpations: Abdomen is soft.  ?   Tenderness: There is no abdominal tenderness.  ?Musculoskeletal:  ?   Cervical back: Normal range of motion.  ?Skin: ?   General: Skin is warm and dry.  ?Neurological:  ?   General: No focal deficit present.  ?   Mental Status: She is alert and oriented to person, place, and time.  ?Psychiatric:     ?   Mood and Affect: Mood normal.     ?   Behavior: Behavior normal.  ? ? ?No results found for this or any previous visit (from the past 24 hour(s)). ? ?No results found. ? ?Assessment/Plan: ?50Y G0 menopausal female with right adnexal mass scheduled for laparoscopic right oophorectomy ? ?Patient has been extensively counseled both in the office and again in the preoperative holding unit and consented for the above listed surgery. She has been consented on the risks of surgery including but not limited to bleeding, infection, damage to surrounding organs, and inherit risks of anesthesia. Additionally she has been counseled on unlikely but not impossible risk of occult malignancy identified on final pathology that might require additional surgery and treatments. We also discussed risks and benefits of performing bilateral oophorectomy given patient age. Patient elects to keep left ovary in situ unless grossly abnormal appearing during intraoperative surveillance. All questions answered to patient satisfaction and consents signed. ? ?-NPO ?-No antibiotics indicated ?-Menopausal, no pregnancy testing indicated ?-SCD VTE ppx ?-Routine intraop/postop care ?-Anticipate DC home today with outpatient follow up in 2 weeks. DC instructions reviewed with patient in preop ? ?Alif Petrak A Katheen Aslin ?04/26/2021, 11:20 AM ? ?

## 2021-04-27 ENCOUNTER — Encounter (HOSPITAL_BASED_OUTPATIENT_CLINIC_OR_DEPARTMENT_OTHER): Payer: Self-pay | Admitting: Obstetrics and Gynecology

## 2021-04-27 NOTE — Anesthesia Postprocedure Evaluation (Signed)
Anesthesia Post Note ? ?Patient: Summer Brady ? ?Procedure(s) Performed: LAPAROSCOPIC OOPHORECTOMY; PARTIAL RIGHT SALPINGECTOMY (Right: Abdomen) ? ?  ? ?Patient location during evaluation: PACU ?Anesthesia Type: General ?Level of consciousness: awake and alert ?Pain management: pain level controlled ?Vital Signs Assessment: post-procedure vital signs reviewed and stable ?Respiratory status: spontaneous breathing, nonlabored ventilation and respiratory function stable ?Cardiovascular status: blood pressure returned to baseline and stable ?Postop Assessment: no apparent nausea or vomiting ?Anesthetic complications: no ? ? ?No notable events documented. ? ?Last Vitals:  ?Vitals:  ? 04/26/21 1545 04/26/21 1615  ?BP: 131/71 134/88  ?Pulse: 86 88  ?Resp: 13 16  ?Temp:  36.6 ?C  ?SpO2: 96% 100%  ?  ?Last Pain:  ?Vitals:  ? 04/27/21 1011  ?TempSrc:   ?PainSc: 4   ? ? ?  ?  ?  ?  ?  ?  ? ?Lynx Goodrich,W. EDMOND ? ? ? ? ?

## 2021-04-28 LAB — CYTOLOGY - NON PAP

## 2021-04-28 LAB — SURGICAL PATHOLOGY

## 2021-05-14 ENCOUNTER — Encounter: Payer: Self-pay | Admitting: Family Medicine

## 2021-05-14 ENCOUNTER — Ambulatory Visit (INDEPENDENT_AMBULATORY_CARE_PROVIDER_SITE_OTHER): Payer: BC Managed Care – PPO | Admitting: Family Medicine

## 2021-05-14 VITALS — BP 125/79 | HR 83 | Temp 97.1°F | Ht 67.0 in | Wt 174.4 lb

## 2021-05-14 DIAGNOSIS — J301 Allergic rhinitis due to pollen: Secondary | ICD-10-CM

## 2021-05-14 DIAGNOSIS — K219 Gastro-esophageal reflux disease without esophagitis: Secondary | ICD-10-CM

## 2021-05-14 DIAGNOSIS — F411 Generalized anxiety disorder: Secondary | ICD-10-CM

## 2021-05-14 DIAGNOSIS — E782 Mixed hyperlipidemia: Secondary | ICD-10-CM | POA: Diagnosis not present

## 2021-05-14 DIAGNOSIS — Z79891 Long term (current) use of opiate analgesic: Secondary | ICD-10-CM | POA: Diagnosis not present

## 2021-05-14 DIAGNOSIS — F410 Panic disorder [episodic paroxysmal anxiety] without agoraphobia: Secondary | ICD-10-CM

## 2021-05-14 MED ORDER — LORAZEPAM 0.5 MG PO TABS: 0.5000 mg | ORAL_TABLET | Freq: Every day | ORAL | 3 refills | Status: DC | PRN

## 2021-05-14 MED ORDER — ATORVASTATIN CALCIUM 10 MG PO TABS: 10.0000 mg | ORAL_TABLET | Freq: Every day | ORAL | 3 refills | Status: DC

## 2021-05-14 MED ORDER — FAMOTIDINE 20 MG PO TABS: 20.0000 mg | ORAL_TABLET | Freq: Every day | ORAL | 3 refills | Status: DC

## 2021-05-14 MED ORDER — MONTELUKAST SODIUM 10 MG PO TABS: 10.0000 mg | ORAL_TABLET | Freq: Every day | ORAL | 3 refills | Status: DC

## 2021-05-14 MED ORDER — OMEPRAZOLE 20 MG PO CPDR: 20.0000 mg | DELAYED_RELEASE_CAPSULE | Freq: Every day | ORAL | 3 refills | Status: DC

## 2021-05-14 NOTE — Progress Notes (Signed)
? ?Subjective: ?CC: Chronic follow-up ?PCP: Janora Norlander, DO ?IOM:BTDH B Schauer is a 56 y.o. female presenting to clinic today for: ? ?1.  Anxiety disorder with panic ?Patient reports very sparing use of Ativan.  She has not been sleeping well since she had her right-sided oophorectomy.  She apparently had a very large cyst on that side and there was concern for possible torsion.  This apparently was incidentally found on a CAT scan looking for renal stones.  She utilized the Ativan once prior to the surgery but has not utilized it since.  Denies any excessive daytime sedation, falls or respiratory depression ? ?2.  GERD ?Patient reports ability of GERD symptoms with Prilosec 20 mg daily, Pepcid 20 mg daily.  No reports of nausea, vomiting ? ?3.  Hyperlipidemia ?Compliant with Lipitor 10 mg daily.  No reports of chest pain or shortness of breath.  Not fasting unfortunately did not come in for fasting labs. ? ? ?ROS: Per HPI ? ?Allergies  ?Allergen Reactions  ? Fluocinolone Rash  ? Latex Rash  ? Penicillins Rash  ? Sulfa Antibiotics Rash  ? Ciprofloxacin Rash  ? ?Past Medical History:  ?Diagnosis Date  ? Allergy   ? GERD (gastroesophageal reflux disease)   ? History of kidney stones   ? last stone was March 1,2023 per pt  04/15/2021  ? Hyperlipidemia   ? Seasonal allergies   ? ? ?Current Outpatient Medications:  ?  Ascorbic Acid (VITAMIN C) 1000 MG tablet, Take 1,000 mg by mouth daily., Disp: , Rfl:  ?  atorvastatin (LIPITOR) 10 MG tablet, Take 1 tablet (10 mg total) by mouth daily., Disp: 90 tablet, Rfl: 3 ?  cholecalciferol (VITAMIN D3) 25 MCG (1000 UNIT) tablet, Take 50 mcg by mouth daily., Disp: , Rfl:  ?  Elderberry 500 MG CAPS, Take 50 mg by mouth daily., Disp: , Rfl:  ?  famotidine (PEPCID) 20 MG tablet, TAKE ONE TABLET DAILY AT BEDTIME (Patient taking differently: Take 20 mg by mouth daily.), Disp: 90 tablet, Rfl: 3 ?  Fexofenadine HCl (ALLEGRA PO), Take 1 tablet by mouth daily., Disp: , Rfl:  ?   ibuprofen (ADVIL) 800 MG tablet, Take 1 tablet (800 mg total) by mouth every 8 (eight) hours as needed., Disp: 30 tablet, Rfl: 0 ?  LORazepam (ATIVAN) 0.5 MG tablet, Take 1 tablet (0.5 mg total) by mouth daily as needed for anxiety (put on file)., Disp: 30 tablet, Rfl: 3 ?  montelukast (SINGULAIR) 10 MG tablet, Take 1 tablet (10 mg total) by mouth daily., Disp: 90 tablet, Rfl: 3 ?  Omega-3 Fatty Acids (FISH OIL) 1000 MG CAPS, Take 1,200 mg by mouth daily., Disp: , Rfl:  ?  omeprazole (PRILOSEC) 20 MG capsule, TAKE ONE CAPSULE BY MOUTH DAILY, Disp: 30 capsule, Rfl: 0 ?  oxyCODONE (OXY IR/ROXICODONE) 5 MG immediate release tablet, Take 1 tablet (5 mg total) by mouth every 6 (six) hours as needed for severe pain., Disp: 15 tablet, Rfl: 0 ?Social History  ? ?Socioeconomic History  ? Marital status: Married  ?  Spouse name: Not on file  ? Number of children: 0  ? Years of education: 12th grade  ? Highest education level: Not on file  ?Occupational History  ? Occupation: office work  ?  Employer: [employer]  ?Tobacco Use  ? Smoking status: Never  ? Smokeless tobacco: Never  ?Vaping Use  ? Vaping Use: Never used  ?Substance and Sexual Activity  ? Alcohol use: No  ?  Drug use: No  ? Sexual activity: Yes  ?  Birth control/protection: Surgical  ?Other Topics Concern  ? Not on file  ?Social History Narrative  ? Not on file  ? ?Social Determinants of Health  ? ?Financial Resource Strain: Not on file  ?Food Insecurity: Not on file  ?Transportation Needs: Not on file  ?Physical Activity: Not on file  ?Stress: Not on file  ?Social Connections: Not on file  ?Intimate Partner Violence: Not on file  ? ?Family History  ?Problem Relation Age of Onset  ? Hypertension Mother   ? Hyperlipidemia Mother   ? Heart attack Father   ? Hypertension Father   ? Cancer Father   ?     prostate   ? Diabetes Father   ? Hypertension Brother   ? Diabetes Maternal Grandmother   ? Cancer Maternal Grandfather   ?     lung  ? Diabetes Paternal Grandfather    ? Breast cancer Maternal Aunt   ? Colon cancer Neg Hx   ? ? ?Objective: ?Office vital signs reviewed. ?BP 125/79   Pulse 83   Temp (!) 97.1 ?F (36.2 ?C)   Ht '5\' 7"'$  (1.702 m)   Wt 174 lb 6.4 oz (79.1 kg)   SpO2 100%   BMI 27.31 kg/m?  ? ?Physical Examination:  ?General: Awake, alert, well nourished, No acute distress ?HEENT:  sclera white, MMM ?Cardio: regular rate and rhythm  ?Pulm:  normal work of breathing on room air ?Psych: mood stable, speech normal. ? ? ?  05/14/2021  ?  3:52 PM 03/11/2021  ?  2:30 PM 11/23/2020  ?  3:49 PM  ?Depression screen PHQ 2/9  ?Decreased Interest 0 0 0  ?Down, Depressed, Hopeless 0 0 0  ?PHQ - 2 Score 0 0 0  ?Altered sleeping  0   ?Tired, decreased energy  0   ?Change in appetite  0   ?Feeling bad or failure about yourself   0   ?Trouble concentrating  0   ?Moving slowly or fidgety/restless  0   ?Suicidal thoughts  0   ?PHQ-9 Score  0   ?Difficult doing work/chores  Not difficult at all   ? ? ?  05/14/2021  ?  3:52 PM 03/11/2021  ?  2:30 PM 11/23/2020  ?  3:49 PM 05/20/2020  ?  2:18 PM  ?GAD 7 : Generalized Anxiety Score  ?Nervous, Anxious, on Edge 0 0 0 0  ?Control/stop worrying 0 0 0 0  ?Worry too much - different things 0 0 0 0  ?Trouble relaxing 0 0 0 0  ?Restless 0 0 0 0  ?Easily annoyed or irritable 0 0 0 0  ?Afraid - awful might happen 0 0 0 0  ?Total GAD 7 Score 0 0 0 0  ?Anxiety Difficulty Not difficult at all Not difficult at all Not difficult at all   ? ? ?Assessment/ Plan: ?56 y.o. female  ? ?Generalized anxiety disorder with panic attacks - Plan: ToxASSURE Select 13 (MW), Urine, LORazepam (ATIVAN) 0.5 MG tablet ? ?Mixed hyperlipidemia - Plan: atorvastatin (LIPITOR) 10 MG tablet ? ?Gastroesophageal reflux disease without esophagitis - Plan: famotidine (PEPCID) 20 MG tablet, omeprazole (PRILOSEC) 20 MG capsule ? ?Seasonal allergic rhinitis due to pollen - Plan: montelukast (SINGULAIR) 10 MG tablet ? ?Anxiety and panic are stable to sparing use of lorazepam.  UDS and CSC were  updated as per office policy today.  National narcotic database reviewed and there were no red flags.  We will plan to reconvene in about 6 months for annual physical with fasting labs ? ?In the meantime, her statin, PPI and H1 blocker have been sent to pharmacy.  Did not discuss allergies today but Singulair was needing to be refilled so this was sent as well ? ?No orders of the defined types were placed in this encounter. ? ?No orders of the defined types were placed in this encounter. ? ? ? ?Janora Norlander, DO ?[facility] ?[phone] ? ?

## 2021-05-20 LAB — TOXASSURE SELECT 13 (MW), URINE

## 2021-05-27 DIAGNOSIS — D2261 Melanocytic nevi of right upper limb, including shoulder: Secondary | ICD-10-CM | POA: Diagnosis not present

## 2021-05-27 DIAGNOSIS — D2239 Melanocytic nevi of other parts of face: Secondary | ICD-10-CM | POA: Diagnosis not present

## 2021-05-27 DIAGNOSIS — D1801 Hemangioma of skin and subcutaneous tissue: Secondary | ICD-10-CM | POA: Diagnosis not present

## 2021-05-27 DIAGNOSIS — Z85828 Personal history of other malignant neoplasm of skin: Secondary | ICD-10-CM | POA: Diagnosis not present

## 2021-05-28 ENCOUNTER — Ambulatory Visit: Payer: BC Managed Care – PPO | Admitting: Family Medicine

## 2021-06-04 ENCOUNTER — Other Ambulatory Visit: Payer: BC Managed Care – PPO

## 2021-08-06 DIAGNOSIS — Z6827 Body mass index (BMI) 27.0-27.9, adult: Secondary | ICD-10-CM | POA: Diagnosis not present

## 2021-08-06 DIAGNOSIS — Z01419 Encounter for gynecological examination (general) (routine) without abnormal findings: Secondary | ICD-10-CM | POA: Diagnosis not present

## 2021-10-11 ENCOUNTER — Encounter: Payer: Self-pay | Admitting: Family Medicine

## 2021-11-12 ENCOUNTER — Ambulatory Visit (INDEPENDENT_AMBULATORY_CARE_PROVIDER_SITE_OTHER): Payer: BC Managed Care – PPO | Admitting: Family Medicine

## 2021-11-12 ENCOUNTER — Encounter: Payer: Self-pay | Admitting: Family Medicine

## 2021-11-12 VITALS — BP 127/81 | HR 84 | Temp 98.3°F | Ht 67.0 in | Wt 178.4 lb

## 2021-11-12 DIAGNOSIS — R739 Hyperglycemia, unspecified: Secondary | ICD-10-CM

## 2021-11-12 DIAGNOSIS — E782 Mixed hyperlipidemia: Secondary | ICD-10-CM | POA: Diagnosis not present

## 2021-11-12 DIAGNOSIS — Z130 Encounter for screening for diseases of the blood and blood-forming organs and certain disorders involving the immune mechanism: Secondary | ICD-10-CM | POA: Diagnosis not present

## 2021-11-12 DIAGNOSIS — K219 Gastro-esophageal reflux disease without esophagitis: Secondary | ICD-10-CM | POA: Diagnosis not present

## 2021-11-12 DIAGNOSIS — R6889 Other general symptoms and signs: Secondary | ICD-10-CM | POA: Diagnosis not present

## 2021-11-12 DIAGNOSIS — E781 Pure hyperglyceridemia: Secondary | ICD-10-CM | POA: Diagnosis not present

## 2021-11-12 DIAGNOSIS — F410 Panic disorder [episodic paroxysmal anxiety] without agoraphobia: Secondary | ICD-10-CM

## 2021-11-12 DIAGNOSIS — Z1321 Encounter for screening for nutritional disorder: Secondary | ICD-10-CM

## 2021-11-12 DIAGNOSIS — F411 Generalized anxiety disorder: Secondary | ICD-10-CM

## 2021-11-12 LAB — BAYER DCA HB A1C WAIVED: HB A1C (BAYER DCA - WAIVED): 6 % — ABNORMAL HIGH (ref 4.8–5.6)

## 2021-11-12 MED ORDER — LORAZEPAM 0.5 MG PO TABS: 0.5000 mg | ORAL_TABLET | Freq: Every day | ORAL | 3 refills | Status: AC | PRN

## 2021-11-12 NOTE — Progress Notes (Signed)
Subjective: CC: Follow-up hyperlipidemia, anxiety PCP: Janora Norlander, DO Summer Brady is a 56 y.o. female presenting to clinic today for:  1.  Hyperlipidemia Patient compliant with Lipitor 10 mg daily.  No chest pain, shortness of breath, abdominal pain or change in activity tolerance.  She had fasting labs drawn this morning.  2.  Anxiety disorder with panic attack Patient rarely has to use the Ativan has not needed at all since her last visit.  The prescription that was sent in earlier this year was never picked up because she did not need it.  She has been doing well.  Staying active at work at [employer], walking daily.  3.  GERD PPI and Pepcid as needed acid reflux.  No reports of GI bleeding.   ROS: Per HPI  Allergies  Allergen Reactions   Fluocinolone Rash   Latex Rash   Penicillins Rash   Sulfa Antibiotics Rash   Ciprofloxacin Rash   Past Medical History:  Diagnosis Date   Allergy    GERD (gastroesophageal reflux disease)    History of kidney stones    last stone was March 1,2023 per pt  04/15/2021   Hyperlipidemia    Seasonal allergies     Current Outpatient Medications:    Ascorbic Acid (VITAMIN C) 1000 MG tablet, Take 1,000 mg by mouth daily., Disp: , Rfl:    atorvastatin (LIPITOR) 10 MG tablet, Take 1 tablet (10 mg total) by mouth daily., Disp: 90 tablet, Rfl: 3   cholecalciferol (VITAMIN D3) 25 MCG (1000 UNIT) tablet, Take 50 mcg by mouth daily., Disp: , Rfl:    Elderberry 500 MG CAPS, Take 50 mg by mouth daily., Disp: , Rfl:    famotidine (PEPCID) 20 MG tablet, Take 1 tablet (20 mg total) by mouth daily., Disp: 90 tablet, Rfl: 3   Fexofenadine HCl (ALLEGRA PO), Take 1 tablet by mouth daily., Disp: , Rfl:    ibuprofen (ADVIL) 800 MG tablet, Take 1 tablet (800 mg total) by mouth every 8 (eight) hours as needed., Disp: 30 tablet, Rfl: 0   LORazepam (ATIVAN) 0.5 MG tablet, Take 1 tablet (0.5 mg total) by mouth daily as needed for anxiety (put on file).,  Disp: 30 tablet, Rfl: 3   montelukast (SINGULAIR) 10 MG tablet, Take 1 tablet (10 mg total) by mouth daily., Disp: 90 tablet, Rfl: 3   Omega-3 Fatty Acids (FISH OIL) 1000 MG CAPS, Take 1,200 mg by mouth daily., Disp: , Rfl:    omeprazole (PRILOSEC) 20 MG capsule, Take 1 capsule (20 mg total) by mouth daily., Disp: 90 capsule, Rfl: 3 Social History   Socioeconomic History   Marital status: Married    Spouse name: Not on file   Number of children: 0   Years of education: 12th grade   Highest education level: Not on file  Occupational History   Occupation: office work    Fish farm manager: [employer]  Tobacco Use   Smoking status: Never   Smokeless tobacco: Never  Vaping Use   Vaping Use: Never used  Substance and Sexual Activity   Alcohol use: No   Drug use: No   Sexual activity: Yes    Birth control/protection: Surgical  Other Topics Concern   Not on file  Social History Narrative   Not on file   Social Determinants of Health   Financial Resource Strain: Not on file  Food Insecurity: Not on file  Transportation Needs: Not on file  Physical Activity: Not on file  Stress: Not on file  Social Connections: Not on file  Intimate Partner Violence: Not on file   Family History  Problem Relation Age of Onset   Hypertension Mother    Hyperlipidemia Mother    Heart attack Father    Hypertension Father    Cancer Father        prostate    Diabetes Father    Hypertension Brother    Diabetes Maternal Grandmother    Cancer Maternal Grandfather        lung   Diabetes Paternal Grandfather    Breast cancer Maternal Aunt    Colon cancer Neg Hx     Objective: Office vital signs reviewed. BP 127/81   Pulse 84   Temp 98.3 F (36.8 C)   Ht _0  (1.702 m)   Wt 178 lb 6.4 oz (80.9 kg)   SpO2 97%   BMI 27.94 kg/m   Physical Examination:  General: Awake, alert, well nourished, No acute distress HEENT: sclera white, MMM Cardio: regular rate and rhythm, S1S2 heard, no murmurs  appreciated Pulm: clear to auscultation bilaterally, no wheezes, rhonchi or rales; normal work of breathing on room air MSK: Ambulating independently with normal gait and station Psych: Mood stable, speech normal, affect appropriate     11/12/2021   12:43 PM 05/14/2021    3:52 PM 03/11/2021    2:30 PM  Depression screen PHQ 2/9  Decreased Interest 0 0 0  Down, Depressed, Hopeless 0 0 0  PHQ - 2 Score 0 0 0  Altered sleeping   0  Tired, decreased energy   0  Change in appetite   0  Feeling bad or failure about yourself    0  Trouble concentrating   0  Moving slowly or fidgety/restless   0  Suicidal thoughts   0  PHQ-9 Score   0  Difficult doing work/chores   Not difficult at all     Assessment/ Plan: 56 y.o. female   Mixed hyperlipidemia - Plan: CBC with Differential/Platelet, CMP14+EGFR, Lipid panel, VITAMIN D 25 Hydroxy (Vit-D Deficiency, Fractures), TSH, TSH, Lipid panel, CMP14+EGFR  Gastroesophageal reflux disease without esophagitis - Plan: CBC with Differential/Platelet, VITAMIN D 25 Hydroxy (Vit-D Deficiency, Fractures), VITAMIN D 25 Hydroxy (Vit-D Deficiency, Fractures), CMP14+EGFR, CBC with Differential/Platelet  Screening, anemia, deficiency, iron - Plan: CANCELED: CBC  Elevated serum glucose - Plan: Bayer DCA Hb A1c Waived  Generalized anxiety disorder with panic attacks - Plan: LORazepam (ATIVAN) 0.5 MG tablet  Encounter for vitamin deficiency screening - Plan: VITAMIN D 25 Hydroxy (Vit-D Deficiency, Fractures)  Fasting lipid collected this morning.  She is compliant with medications and not having any concerning features today.  Continue current regimen for now.  Further recommendations pending the results of lipid panel  GERD is chronic and stable.  No changes.  Check CBC, vitamin D given chronic PPI use.  A1c collected given history of elevated serum glucose.  Anxiety disorder is chronic and stable.  She has not needed the Ativan at all and never did get the  prescription that I sent in earlier this year.  I have gone ahead and renewed this and placed it on file at her local drugstore in the event that she does require it as her current one is expired  Orders Placed This Encounter  Procedures   CBC with Differential/Platelet    Standing Status:   Future    Number of Occurrences:   1    Standing Expiration Date:  11/13/2022   CMP14+EGFR    Standing Status:   Future    Number of Occurrences:   1    Standing Expiration Date:   11/13/2022   Lipid panel    Standing Status:   Future    Number of Occurrences:   1    Standing Expiration Date:   11/13/2022   VITAMIN D 25 Hydroxy (Vit-D Deficiency, Fractures)    Standing Status:   Future    Number of Occurrences:   1    Standing Expiration Date:   11/13/2022   TSH    Standing Status:   Future    Number of Occurrences:   1    Standing Expiration Date:   11/13/2022   No orders of the defined types were placed in this encounter.    Janora Norlander, DO [place] [phone]

## 2021-11-13 LAB — CMP14+EGFR
ALT: 35 IU/L — ABNORMAL HIGH (ref 0–32)
AST: 25 IU/L (ref 0–40)
Albumin/Globulin Ratio: 2.4 — ABNORMAL HIGH (ref 1.2–2.2)
Albumin: 4.4 g/dL (ref 3.8–4.9)
Alkaline Phosphatase: 120 IU/L (ref 44–121)
BUN/Creatinine Ratio: 15 (ref 9–23)
BUN: 11 mg/dL (ref 6–24)
Bilirubin Total: 0.9 mg/dL (ref 0.0–1.2)
CO2: 24 mmol/L (ref 20–29)
Calcium: 9.4 mg/dL (ref 8.7–10.2)
Chloride: 105 mmol/L (ref 96–106)
Creatinine, Ser: 0.73 mg/dL (ref 0.57–1.00)
Globulin, Total: 1.8 g/dL (ref 1.5–4.5)
Glucose: 139 mg/dL — ABNORMAL HIGH (ref 70–99)
Potassium: 3.7 mmol/L (ref 3.5–5.2)
Sodium: 144 mmol/L (ref 134–144)
Total Protein: 6.2 g/dL (ref 6.0–8.5)
eGFR: 95 mL/min/{1.73_m2} (ref >59)

## 2021-11-13 LAB — CBC WITH DIFFERENTIAL/PLATELET
Basophils Absolute: 0 10*3/uL (ref 0.0–0.2)
Basos: 0 %
EOS (ABSOLUTE): 0.1 10*3/uL (ref 0.0–0.4)
Eos: 1 %
Hematocrit: 37.7 % (ref 34.0–46.6)
Hemoglobin: 13.1 g/dL (ref 11.1–15.9)
Immature Grans (Abs): 0 10*3/uL (ref 0.0–0.1)
Immature Granulocytes: 0 %
Lymphocytes Absolute: 2.1 10*3/uL (ref 0.7–3.1)
Lymphs: 31 %
MCH: 30.8 pg (ref 26.6–33.0)
MCHC: 34.7 g/dL (ref 31.5–35.7)
MCV: 89 fL (ref 79–97)
Monocytes Absolute: 0.3 10*3/uL (ref 0.1–0.9)
Monocytes: 5 %
Neutrophils Absolute: 4.2 10*3/uL (ref 1.4–7.0)
Neutrophils: 63 %
Platelets: 195 10*3/uL (ref 150–450)
RBC: 4.26 x10E6/uL (ref 3.77–5.28)
RDW: 12.4 % (ref 11.7–15.4)
WBC: 6.7 10*3/uL (ref 3.4–10.8)

## 2021-11-13 LAB — LIPID PANEL
Chol/HDL Ratio: 3.6 ratio (ref 0.0–4.4)
Cholesterol, Total: 154 mg/dL (ref 100–199)
HDL: 43 mg/dL (ref >39)
LDL Chol Calc (NIH): 83 mg/dL (ref 0–99)
Triglycerides: 163 mg/dL — ABNORMAL HIGH (ref 0–149)
VLDL Cholesterol Cal: 28 mg/dL (ref 5–40)

## 2021-11-13 LAB — TSH: TSH: 1.45 u[IU]/mL (ref 0.450–4.500)

## 2021-11-13 LAB — VITAMIN D 25 HYDROXY (VIT D DEFICIENCY, FRACTURES): Vit D, 25-Hydroxy: 33.6 ng/mL (ref 30.0–100.0)

## 2021-11-16 ENCOUNTER — Encounter: Payer: Self-pay | Admitting: Family Medicine

## 2022-02-17 ENCOUNTER — Other Ambulatory Visit: Payer: Self-pay | Admitting: Family Medicine

## 2022-02-17 DIAGNOSIS — J301 Allergic rhinitis due to pollen: Secondary | ICD-10-CM

## 2022-03-10 ENCOUNTER — Other Ambulatory Visit: Payer: Self-pay | Admitting: Obstetrics and Gynecology

## 2022-03-10 DIAGNOSIS — Z1231 Encounter for screening mammogram for malignant neoplasm of breast: Secondary | ICD-10-CM

## 2022-04-28 ENCOUNTER — Other Ambulatory Visit: Payer: Self-pay

## 2022-04-28 ENCOUNTER — Telehealth: Payer: Self-pay | Admitting: Family Medicine

## 2022-04-28 DIAGNOSIS — R739 Hyperglycemia, unspecified: Secondary | ICD-10-CM

## 2022-04-28 DIAGNOSIS — Z1321 Encounter for screening for nutritional disorder: Secondary | ICD-10-CM

## 2022-04-28 DIAGNOSIS — Z79899 Other long term (current) drug therapy: Secondary | ICD-10-CM

## 2022-04-28 DIAGNOSIS — Z130 Encounter for screening for diseases of the blood and blood-forming organs and certain disorders involving the immune mechanism: Secondary | ICD-10-CM

## 2022-04-28 NOTE — Telephone Encounter (Signed)
Labs have been added for pt

## 2022-04-28 NOTE — Telephone Encounter (Signed)
Patient has appointment on 5/3 and wanted to come in for her labs before that day. Added her to the lab schedule for 4/26, please add orders.

## 2022-04-29 ENCOUNTER — Ambulatory Visit
Admission: RE | Admit: 2022-04-29 | Discharge: 2022-04-29 | Disposition: A | Discharge status: Home / Self Care | Payer: BC Managed Care – PPO | Source: Ambulatory Visit | Attending: Obstetrics and Gynecology | Admitting: Obstetrics and Gynecology

## 2022-04-29 DIAGNOSIS — Z1231 Encounter for screening mammogram for malignant neoplasm of breast: Secondary | ICD-10-CM | POA: Diagnosis not present

## 2022-05-06 ENCOUNTER — Other Ambulatory Visit: Payer: BC Managed Care – PPO

## 2022-05-06 ENCOUNTER — Other Ambulatory Visit: Payer: Self-pay

## 2022-05-06 DIAGNOSIS — Z1321 Encounter for screening for nutritional disorder: Secondary | ICD-10-CM

## 2022-05-06 DIAGNOSIS — Z79899 Other long term (current) drug therapy: Secondary | ICD-10-CM

## 2022-05-06 DIAGNOSIS — E782 Mixed hyperlipidemia: Secondary | ICD-10-CM

## 2022-05-06 DIAGNOSIS — Z79891 Long term (current) use of opiate analgesic: Secondary | ICD-10-CM | POA: Diagnosis not present

## 2022-05-06 DIAGNOSIS — R739 Hyperglycemia, unspecified: Secondary | ICD-10-CM

## 2022-05-06 DIAGNOSIS — Z130 Encounter for screening for diseases of the blood and blood-forming organs and certain disorders involving the immune mechanism: Secondary | ICD-10-CM | POA: Diagnosis not present

## 2022-05-06 LAB — CBC WITH DIFFERENTIAL/PLATELET
Basophils Absolute: 0 10*3/uL (ref 0.0–0.2)
RDW: 12.5 % (ref 11.7–15.4)
WBC: 5.8 10*3/uL (ref 3.4–10.8)

## 2022-05-06 LAB — BAYER DCA HB A1C WAIVED: HB A1C (BAYER DCA - WAIVED): 5.6 % (ref 4.8–5.6)

## 2022-05-06 LAB — CMP14+EGFR
AST: 17 IU/L (ref 0–40)
Albumin/Globulin Ratio: 2.3 — ABNORMAL HIGH (ref 1.2–2.2)
Alkaline Phosphatase: 110 IU/L (ref 44–121)
CO2: 24 mmol/L (ref 20–29)
Chloride: 105 mmol/L (ref 96–106)
Total Protein: 6.5 g/dL (ref 6.0–8.5)
eGFR: 105 mL/min/{1.73_m2} (ref >59)

## 2022-05-06 LAB — LIPID PANEL

## 2022-05-07 LAB — CMP14+EGFR
ALT: 22 IU/L (ref 0–32)
Albumin: 4.5 g/dL (ref 3.8–4.9)
BUN/Creatinine Ratio: 14 (ref 9–23)
BUN: 8 mg/dL (ref 6–24)
Bilirubin Total: 1.3 mg/dL — ABNORMAL HIGH (ref 0.0–1.2)
Calcium: 9.3 mg/dL (ref 8.7–10.2)
Creatinine, Ser: 0.57 mg/dL (ref 0.57–1.00)
Globulin, Total: 2 g/dL (ref 1.5–4.5)
Glucose: 120 mg/dL — ABNORMAL HIGH (ref 70–99)
Potassium: 3.6 mmol/L (ref 3.5–5.2)
Sodium: 146 mmol/L — ABNORMAL HIGH (ref 134–144)

## 2022-05-07 LAB — LIPID PANEL
Chol/HDL Ratio: 3.6 ratio (ref 0.0–4.4)
HDL: 39 mg/dL — ABNORMAL LOW (ref >39)
Triglycerides: 174 mg/dL — ABNORMAL HIGH (ref 0–149)
VLDL Cholesterol Cal: 30 mg/dL (ref 5–40)

## 2022-05-07 LAB — CBC WITH DIFFERENTIAL/PLATELET
Basos: 1 %
EOS (ABSOLUTE): 0.1 10*3/uL (ref 0.0–0.4)
Eos: 1 %
Hematocrit: 41.2 % (ref 34.0–46.6)
Hemoglobin: 13.8 g/dL (ref 11.1–15.9)
Immature Grans (Abs): 0 10*3/uL (ref 0.0–0.1)
Immature Granulocytes: 0 %
Lymphocytes Absolute: 2 10*3/uL (ref 0.7–3.1)
Lymphs: 35 %
MCH: 30.7 pg (ref 26.6–33.0)
MCHC: 33.5 g/dL (ref 31.5–35.7)
MCV: 92 fL (ref 79–97)
Monocytes Absolute: 0.4 10*3/uL (ref 0.1–0.9)
Monocytes: 6 %
Neutrophils Absolute: 3.3 10*3/uL (ref 1.4–7.0)
Neutrophils: 57 %
Platelets: 190 10*3/uL (ref 150–450)
RBC: 4.5 x10E6/uL (ref 3.77–5.28)

## 2022-05-07 LAB — VITAMIN D 25 HYDROXY (VIT D DEFICIENCY, FRACTURES): Vit D, 25-Hydroxy: 28.4 ng/mL — ABNORMAL LOW (ref 30.0–100.0)

## 2022-05-11 LAB — TOXASSURE SELECT 13 (MW), URINE

## 2022-05-13 ENCOUNTER — Ambulatory Visit: Payer: BC Managed Care – PPO | Admitting: Family Medicine

## 2022-05-13 ENCOUNTER — Encounter: Payer: Self-pay | Admitting: Family Medicine

## 2022-05-13 VITALS — BP 122/70 | HR 85 | Temp 98.3°F | Ht 67.0 in | Wt 170.0 lb

## 2022-05-13 DIAGNOSIS — R739 Hyperglycemia, unspecified: Secondary | ICD-10-CM | POA: Diagnosis not present

## 2022-05-13 DIAGNOSIS — J301 Allergic rhinitis due to pollen: Secondary | ICD-10-CM | POA: Diagnosis not present

## 2022-05-13 DIAGNOSIS — K219 Gastro-esophageal reflux disease without esophagitis: Secondary | ICD-10-CM

## 2022-05-13 DIAGNOSIS — E782 Mixed hyperlipidemia: Secondary | ICD-10-CM | POA: Diagnosis not present

## 2022-05-13 MED ORDER — AZELASTINE HCL 0.1 % NA SOLN: 1.0000 | Freq: Two times a day (BID) | NASAL | 12 refills | Status: DC

## 2022-05-13 MED ORDER — FAMOTIDINE 20 MG PO TABS: 20.0000 mg | ORAL_TABLET | Freq: Every day | ORAL | 3 refills | Status: DC

## 2022-05-13 MED ORDER — MONTELUKAST SODIUM 10 MG PO TABS: 10.0000 mg | ORAL_TABLET | Freq: Every day | ORAL | 3 refills | Status: DC

## 2022-05-13 MED ORDER — OMEPRAZOLE 20 MG PO CPDR: 20.0000 mg | DELAYED_RELEASE_CAPSULE | Freq: Every day | ORAL | 3 refills | Status: DC

## 2022-05-13 MED ORDER — ATORVASTATIN CALCIUM 10 MG PO TABS: 10.0000 mg | ORAL_TABLET | Freq: Every day | ORAL | 3 refills | Status: DC

## 2022-05-13 NOTE — Progress Notes (Signed)
Subjective: CC:*** PCP: Summer Ip, DO ZOX:WRUE Summer Brady is a 57 y.o. female presenting to clinic today for:  1. ***   ROS: Per HPI  Allergies  Allergen Reactions   Fluocinolone Rash   Latex Rash   Penicillins Rash   Sulfa Antibiotics Rash   Ciprofloxacin Rash   Past Medical History:  Diagnosis Date   Allergy    GERD (gastroesophageal reflux disease)    History of kidney stones    last stone was March 1,2023 per pt  04/15/2021   Hyperlipidemia    Seasonal allergies     Current Outpatient Medications:    Ascorbic Acid (VITAMIN C) 1000 MG tablet, Take 1,000 mg by mouth daily., Disp: , Rfl:    atorvastatin (LIPITOR) 10 MG tablet, Take 1 tablet (10 mg total) by mouth daily., Disp: 90 tablet, Rfl: 3   cholecalciferol (VITAMIN D3) 25 MCG (1000 UNIT) tablet, Take 50 mcg by mouth daily., Disp: , Rfl:    Elderberry 500 MG CAPS, Take 50 mg by mouth daily., Disp: , Rfl:    famotidine (PEPCID) 20 MG tablet, Take 1 tablet (20 mg total) by mouth daily., Disp: 90 tablet, Rfl: 3   Fexofenadine HCl (ALLEGRA PO), Take 1 tablet by mouth daily., Disp: , Rfl:    ibuprofen (ADVIL) 800 MG tablet, Take 1 tablet (800 mg total) by mouth every 8 (eight) hours as needed., Disp: 30 tablet, Rfl: 0   LORazepam (ATIVAN) 0.5 MG tablet, Take 1 tablet (0.5 mg total) by mouth daily as needed for anxiety (put on file)., Disp: 30 tablet, Rfl: 3   montelukast (SINGULAIR) 10 MG tablet, TAKE ONE (1) TABLET BY MOUTH EVERY DAY, Disp: 90 tablet, Rfl: 1   Omega-3 Fatty Acids (FISH OIL) 1000 MG CAPS, Take 1,200 mg by mouth daily., Disp: , Rfl:    omeprazole (PRILOSEC) 20 MG capsule, Take 1 capsule (20 mg total) by mouth daily., Disp: 90 capsule, Rfl: 3 Social History   Socioeconomic History   Marital status: Married    Spouse name: Not on file   Number of children: 0   Years of education: 12th grade   Highest education level: 12th grade  Occupational History   Occupation: office work    Associate Professor: [employer]  Tobacco Use   Smoking status: Never   Smokeless tobacco: Never  Vaping Use   Vaping Use: Never used  Substance and Sexual Activity   Alcohol use: No   Drug use: No   Sexual activity: Yes    Birth control/protection: Surgical  Other Topics Concern   Not on file  Social History Narrative   Not on file   Social Determinants of Health   Financial Resource Strain: Low Risk  (05/09/2022)   Overall Financial Resource Strain (CARDIA)    Difficulty of Paying Living Expenses: Not hard at all  Food Insecurity: No Food Insecurity (05/09/2022)   Hunger Vital Sign    Worried About Running Out of Food in the Last Year: Never true    Ran Out of Food in the Last Year: Never true  Transportation Needs: No Transportation Needs (05/09/2022)   PRAPARE - Administrator, Civil Service (Medical): No    Lack of Transportation (Non-Medical): No  Physical Activity: Insufficiently Active (05/09/2022)   Exercise Vital Sign    Days of Exercise per Week: 2 days    Minutes of Exercise per Session: 30 min  Stress: No Stress Concern Present (05/09/2022)   [place]  [employer] Occup[employer] - Occupational Stress Questionnaire    Feeling of Stress : Not at all  Social Connections: Socially Integrated (05/09/2022)   Social Connection and Isolation Panel [NHANES]    Frequency of Communication with Friends and Family: More than three times a week    Frequency of Social Gatherings with Friends and Family: Three times a week    Attends Religious Services: More than 4 times per year    Active Member of Clubs or Organizations: Yes    Attends Banker Meetings: 1 to 4 times per year    Marital Status: Married  Catering manager Violence: Not on file   Family History  Problem Relation Age of Onset   Hypertension Mother    Hyperlipidemia Mother    Heart attack Father    Hypertension Father    Cancer Father        prostate    Diabetes Father    Hypertension Brother     Diabetes Maternal Grandmother    Cancer Maternal Grandfather        lung   Diabetes Paternal Grandfather    Breast cancer Maternal Aunt    Colon cancer Neg Hx     Objective: Office vital signs reviewed. There were no vitals taken for this visit.  Physical Examination:  General: Awake, alert, *** nourished, No acute distress HEENT: Normal    Neck: No masses palpated. No lymphadenopathy    Ears: Tympanic membranes intact, normal light reflex, no erythema, no bulging    Eyes: PERRLA, extraocular membranes intact, sclera ***    Nose: nasal turbinates moist, *** nasal discharge    Throat: moist mucus membranes, no erythema, *** tonsillar exudate.  Airway is patent Cardio: regular rate and rhythm, S1S2 heard, no murmurs appreciated Pulm: clear to auscultation bilaterally, no wheezes, rhonchi or rales; normal work of breathing on room air GI: soft, non-tender, non-distended, bowel sounds present x4, no hepatomegaly, no splenomegaly, no masses GU: external vaginal tissue ***, cervix ***, *** punctate lesions on cervix appreciated, *** discharge from cervical os, *** bleeding, *** cervical motion tenderness, *** abdominal/ adnexal masses Extremities: warm, well perfused, No edema, cyanosis or clubbing; +*** pulses bilaterally MSK: *** gait and *** station Skin: dry; intact; no rashes or lesions Neuro: *** Strength and light touch sensation grossly intact, *** DTRs ***/4  Assessment/ Plan: 57 y.o. female   ***  No orders of the defined types were placed in this encounter.  No orders of the defined types were placed in this encounter.    Summer Ip, DO [facility] [phone]

## 2022-08-10 DIAGNOSIS — Z01419 Encounter for gynecological examination (general) (routine) without abnormal findings: Secondary | ICD-10-CM | POA: Diagnosis not present

## 2022-08-15 DIAGNOSIS — D2239 Melanocytic nevi of other parts of face: Secondary | ICD-10-CM | POA: Diagnosis not present

## 2022-08-15 DIAGNOSIS — D2262 Melanocytic nevi of left upper limb, including shoulder: Secondary | ICD-10-CM | POA: Diagnosis not present

## 2022-08-15 DIAGNOSIS — Z85828 Personal history of other malignant neoplasm of skin: Secondary | ICD-10-CM | POA: Diagnosis not present

## 2022-08-15 DIAGNOSIS — L720 Epidermal cyst: Secondary | ICD-10-CM | POA: Diagnosis not present

## 2022-12-27 ENCOUNTER — Other Ambulatory Visit: Payer: BC Managed Care – PPO

## 2022-12-27 DIAGNOSIS — R739 Hyperglycemia, unspecified: Secondary | ICD-10-CM | POA: Diagnosis not present

## 2022-12-27 DIAGNOSIS — E782 Mixed hyperlipidemia: Secondary | ICD-10-CM

## 2022-12-27 LAB — CMP14+EGFR
ALT: 33 [IU]/L — ABNORMAL HIGH (ref 0–32)
AST: 27 [IU]/L (ref 0–40)
Albumin: 4.3 g/dL (ref 3.8–4.9)
Alkaline Phosphatase: 138 [IU]/L — ABNORMAL HIGH (ref 44–121)
BUN/Creatinine Ratio: 15 (ref 9–23)
BUN: 9 mg/dL (ref 6–24)
Bilirubin Total: 1.1 mg/dL (ref 0.0–1.2)
CO2: 25 mmol/L (ref 20–29)
Calcium: 9.3 mg/dL (ref 8.7–10.2)
Chloride: 104 mmol/L (ref 96–106)
Creatinine, Ser: 0.62 mg/dL (ref 0.57–1.00)
Globulin, Total: 2.4 g/dL (ref 1.5–4.5)
Glucose: 173 mg/dL — ABNORMAL HIGH (ref 70–99)
Potassium: 3.8 mmol/L (ref 3.5–5.2)
Sodium: 145 mmol/L — ABNORMAL HIGH (ref 134–144)
Total Protein: 6.7 g/dL (ref 6.0–8.5)
eGFR: 103 mL/min/{1.73_m2} (ref >59)

## 2022-12-27 LAB — LIPID PANEL
Chol/HDL Ratio: 3.6 {ratio} (ref 0.0–4.4)
Cholesterol, Total: 145 mg/dL (ref 100–199)
HDL: 40 mg/dL (ref >39)
LDL Chol Calc (NIH): 81 mg/dL (ref 0–99)
Triglycerides: 133 mg/dL (ref 0–149)
VLDL Cholesterol Cal: 24 mg/dL (ref 5–40)

## 2022-12-27 LAB — BAYER DCA HB A1C WAIVED: HB A1C (BAYER DCA - WAIVED): 6.8 % — ABNORMAL HIGH (ref 4.8–5.6)

## 2022-12-27 LAB — TSH: TSH: 1.89 u[IU]/mL (ref 0.450–4.500)

## 2022-12-30 ENCOUNTER — Encounter: Payer: Self-pay | Admitting: Family Medicine

## 2022-12-30 ENCOUNTER — Ambulatory Visit: Payer: BC Managed Care – PPO | Admitting: Family Medicine

## 2022-12-30 VITALS — BP 131/66 | HR 90 | Temp 98.4°F | Ht 67.0 in | Wt 176.0 lb

## 2022-12-30 DIAGNOSIS — R7401 Elevation of levels of liver transaminase levels: Secondary | ICD-10-CM

## 2022-12-30 DIAGNOSIS — Z0001 Encounter for general adult medical examination with abnormal findings: Secondary | ICD-10-CM | POA: Diagnosis not present

## 2022-12-30 DIAGNOSIS — E1169 Type 2 diabetes mellitus with other specified complication: Secondary | ICD-10-CM

## 2022-12-30 DIAGNOSIS — E785 Hyperlipidemia, unspecified: Secondary | ICD-10-CM | POA: Diagnosis not present

## 2022-12-30 DIAGNOSIS — K219 Gastro-esophageal reflux disease without esophagitis: Secondary | ICD-10-CM | POA: Diagnosis not present

## 2022-12-30 DIAGNOSIS — J301 Allergic rhinitis due to pollen: Secondary | ICD-10-CM | POA: Diagnosis not present

## 2022-12-30 DIAGNOSIS — E119 Type 2 diabetes mellitus without complications: Secondary | ICD-10-CM

## 2022-12-30 DIAGNOSIS — Z0000 Encounter for general adult medical examination without abnormal findings: Secondary | ICD-10-CM

## 2022-12-30 MED ORDER — LANCETS MISC. MISC: 3 refills | Status: AC

## 2022-12-30 MED ORDER — BLOOD GLUCOSE MONITORING SUPPL DEVI: 0 refills | Status: AC

## 2022-12-30 MED ORDER — BLOOD GLUCOSE TEST VI STRP: ORAL_STRIP | 3 refills | Status: AC

## 2022-12-30 MED ORDER — FAMOTIDINE 20 MG PO TABS: 20.0000 mg | ORAL_TABLET | Freq: Every day | ORAL | 3 refills | Status: DC

## 2022-12-30 MED ORDER — ATORVASTATIN CALCIUM 10 MG PO TABS: 10.0000 mg | ORAL_TABLET | Freq: Every day | ORAL | 3 refills | Status: DC

## 2022-12-30 MED ORDER — MONTELUKAST SODIUM 10 MG PO TABS: 10.0000 mg | ORAL_TABLET | Freq: Every day | ORAL | 3 refills | Status: DC

## 2022-12-30 MED ORDER — OMEPRAZOLE 20 MG PO CPDR: 20.0000 mg | DELAYED_RELEASE_CAPSULE | Freq: Every day | ORAL | 3 refills | Status: DC

## 2022-12-30 MED ORDER — LANCET DEVICE MISC: 0 refills | Status: AC

## 2022-12-30 NOTE — Progress Notes (Signed)
Summer Brady is a 57 y.o. female presents to office today for annual physical exam examination.    Concerns today include: 1. Type 2 Diabetes with hyperlipidemia:  Newly diagnosed with recent labs with A1c of 6.8 and fasting glucose >170.  She had some mild elevation in her liver function test.  She is compliant with cholesterol medication.  She admits that she eats fast food every morning and drinks a Pepsi every morning.  She thinks that she can get her sugar under control with diet modifications.  Bread will be her biggest challenge  Last eye exam: Needs Last foot exam: Needs Last A1c:  Lab Results  Component Value Date   HGBA1C 6.8 (H) 12/27/2022   Nephropathy screen indicated?:  Needs Last flu, zoster and/or pneumovax:  Immunization History  Administered Date(s) Administered   Influenza Inj Mdck Quad Pf 09/28/2021, 09/27/2022   Influenza, Seasonal, Injecte, Preservative Fre 10/11/2014   Influenza,inj,Quad PF,6+ Mos 09/18/2017, 09/30/2020   Influenza-Unspecified 10/11/2014, 09/23/2019   Moderna Sars-Covid-2 Vaccination 03/27/2019, 04/25/2019, 01/01/2020    ROS: Denies dizziness, LOC, polyuria, polydipsia, unintended weight loss/gain, foot ulcerations, numbness or tingling in extremities, shortness of breath or chest pain.   Occupation: Works some at [employer] in [employer], Substance use: None Health Maintenance Due  Topic Date Due   Diabetic kidney evaluation - Urine ACR  Never done   Refills needed today: All  Immunization History  Administered Date(s) Administered   Influenza Inj Mdck Quad Pf 09/28/2021, 09/27/2022   Influenza, Seasonal, Injecte, Preservative Fre 10/11/2014   Influenza,inj,Quad PF,6+ Mos 09/18/2017, 09/30/2020   Influenza-Unspecified 10/11/2014, 09/23/2019   Moderna Sars-Covid-2 Vaccination 03/27/2019, 04/25/2019, 01/01/2020   Past Medical History:  Diagnosis Date   Allergy    GERD (gastroesophageal reflux disease)    History of kidney stones     last stone was March 1,2023 per pt  04/15/2021   Hyperlipidemia    Seasonal allergies    Social History   Socioeconomic History   Marital status: Married    Spouse name: Not on file   Number of children: 0   Years of education: 12th grade   Highest education level: 12th grade  Occupational History   Occupation: office work    Associate Professor: [employer]  Tobacco Use   Smoking status: Never   Smokeless tobacco: Never  Vaping Use   Vaping status: Never Used  Substance and Sexual Activity   Alcohol use: No   Drug use: No   Sexual activity: Yes    Birth control/protection: Surgical  Other Topics Concern   Not on file  Social History Narrative   Not on file   Social Drivers of Health   Financial Resource Strain: Low Risk  (05/09/2022)   Overall Financial Resource Strain (CARDIA)    Difficulty of Paying Living Expenses: Not hard at all  Food Insecurity: No Food Insecurity (05/09/2022)   Hunger Vital Sign    Worried About Running Out of Food in the Last Year: Never true    Ran Out of Food in the Last Year: Never true  Transportation Needs: No Transportation Needs (05/09/2022)   PRAPARE - Administrator, Civil Service (Medical): No    Lack of Transportation (Non-Medical): No  Physical Activity: Insufficiently Active (05/09/2022)   Exercise Vital Sign    Days of Exercise per Week: 2 days    Minutes of Exercise per Session: 30 min  Stress: No Stress Concern Present (05/09/2022)   [employer] of Occupational Health -  Occupational Stress Questionnaire    Feeling of Stress : Not at all  Social Connections: Socially Integrated (05/09/2022)   Social Connection and Isolation Panel [NHANES]    Frequency of Communication with Friends and Family: More than three times a week    Frequency of Social Gatherings with Friends and Family: Three times a week    Attends Religious Services: More than 4 times per year    Active Member of Clubs or Organizations: Yes    Attends Tax inspector Meetings: 1 to 4 times per year    Marital Status: Married  Catering manager Violence: Not on file   Past Surgical History:  Procedure Laterality Date   BREAST BIOPSY Left 03/2016   benign   LAPAROSCOPIC SALPINGO OOPHERECTOMY Right 04/26/2021   Procedure: LAPAROSCOPIC OOPHORECTOMY; PARTIAL RIGHT SALPINGECTOMY;  Surgeon: Toy Baker, DO;  Location: [facility];  Service: Gynecology;  Laterality: Right;   LUMBAR SPINE SURGERY  2009   MOHS SURGERY  09/2013   nose   RADIOACTIVE SEED GUIDED EXCISIONAL BREAST BIOPSY Left 03/31/2016   Procedure: RADIOACTIVE SEED GUIDED EXCISIONAL LEFT BREAST BIOPSY;  Surgeon: Emelia Loron, MD;  Location: [facility];  Service: General;  Laterality: Left;   TOTAL ABDOMINAL HYSTERECTOMY  1997   WRIST FRACTURE SURGERY Right 2012   Family History  Problem Relation Age of Onset   Hypertension Mother    Hyperlipidemia Mother    Heart attack Father    Hypertension Father    Cancer Father        prostate    Diabetes Father    Hypertension Brother    Diabetes Maternal Grandmother    Cancer Maternal Grandfather        lung   Diabetes Paternal Grandfather    Breast cancer Maternal Aunt    Colon cancer Neg Hx     Current Outpatient Medications:    Ascorbic Acid (VITAMIN C) 1000 MG tablet, Take 1,000 mg by mouth daily., Disp: , Rfl:    atorvastatin (LIPITOR) 10 MG tablet, Take 1 tablet (10 mg total) by mouth daily., Disp: 90 tablet, Rfl: 3   azelastine (ASTELIN) 0.1 % nasal spray, Place 1 spray into both nostrils 2 (two) times daily., Disp: 30 mL, Rfl: 12   cholecalciferol (VITAMIN D3) 25 MCG (1000 UNIT) tablet, Take 50 mcg by mouth daily., Disp: , Rfl:    Elderberry 500 MG CAPS, Take 50 mg by mouth daily., Disp: , Rfl:    famotidine (PEPCID) 20 MG tablet, Take 1 tablet (20 mg total) by mouth daily., Disp: 90 tablet, Rfl: 3   Fexofenadine HCl (ALLEGRA PO), Take 1 tablet by mouth daily., Disp: , Rfl:     ibuprofen (ADVIL) 800 MG tablet, Take 1 tablet (800 mg total) by mouth every 8 (eight) hours as needed., Disp: 30 tablet, Rfl: 0   LORazepam (ATIVAN) 0.5 MG tablet, Take 1 tablet (0.5 mg total) by mouth daily as needed for anxiety (put on file)., Disp: 30 tablet, Rfl: 3   montelukast (SINGULAIR) 10 MG tablet, Take 1 tablet (10 mg total) by mouth daily., Disp: 90 tablet, Rfl: 3   Omega-3 Fatty Acids (FISH OIL) 1000 MG CAPS, Take 1,200 mg by mouth daily., Disp: , Rfl:    omeprazole (PRILOSEC) 20 MG capsule, Take 1 capsule (20 mg total) by mouth daily., Disp: 90 capsule, Rfl: 3  Allergies  Allergen Reactions   Fluocinolone Rash   Latex Rash   Penicillins Rash   Sulfa Antibiotics  Rash   Ciprofloxacin Rash     ROS: Review of Systems Pertinent items noted in HPI and remainder of comprehensive ROS otherwise negative.    Physical exam BP 131/66   Pulse 90   Temp 98.4 F (36.9 C)   Ht 5\' 7"  (1.702 m)   SpO2 98%   BMI 26.63 kg/m  General appearance: alert, cooperative, appears stated age, and no distress Head: Normocephalic, without obvious abnormality, atraumatic Eyes: negative findings: lids and lashes normal, conjunctivae and sclerae normal, corneas clear, and pupils equal, round, reactive to light and accomodation Ears: normal TM's and external ear canals both ears Nose: Nares normal. Septum midline. Mucosa normal. No drainage or sinus tenderness. Throat: lips, mucosa, and tongue normal; teeth and gums normal Neck: no adenopathy, supple, symmetrical, trachea midline, and thyroid not enlarged, symmetric, no tenderness/mass/nodules Back: symmetric, no curvature. ROM normal. No CVA tenderness. Lungs: clear to auscultation bilaterally Heart: regular rate and rhythm, S1, S2 normal, no murmur, click, rub or gallop Abdomen: soft, non-tender; bowel sounds normal; no masses,  no organomegaly Extremities: extremities normal, atraumatic, no cyanosis or edema Pulses: 2+ and symmetric Skin:  Skin color, texture, turgor normal. No rashes or lesions Lymph nodes: Cervical, supraclavicular, and axillary nodes normal. Neurologic: Grossly normal      12/30/2022    3:41 PM 05/13/2022    1:05 PM 11/12/2021   12:43 PM  Depression screen PHQ 2/9  Decreased Interest 0 0 0  Down, Depressed, Hopeless 0 0 0  PHQ - 2 Score 0 0 0  Altered sleeping 0 0   Tired, decreased energy 0 0   Change in appetite 0 0   Feeling bad or failure about yourself  0 0   Trouble concentrating 0 0   Moving slowly or fidgety/restless 0 0   Suicidal thoughts 0 0   PHQ-9 Score 0 0   Difficult doing work/chores Not difficult at all Not difficult at all       12/30/2022    3:41 PM 05/13/2022    1:05 PM 11/12/2021   12:43 PM 05/14/2021    3:52 PM  GAD 7 : Generalized Anxiety Score  Nervous, Anxious, on Edge 0 0 0 0  Control/stop worrying 0 0 0 0  Worry too much - different things 0 0 0 0  Trouble relaxing 0 0 0 0  Restless 0 0 0 0  Easily annoyed or irritable 0 0 0 0  Afraid - awful might happen 0 0 0 0  Total GAD 7 Score 0 0 0 0  Anxiety Difficulty  Not difficult at all  Not difficult at all   Recent Results (from the past 2160 hours)  Bayer DCA Hb A1c Waived     Status: Abnormal   Collection Time: 12/27/22  8:25 AM  Result Value Ref Range   HB A1C (BAYER DCA - WAIVED) 6.8 (H) 4.8 - 5.6 %    Comment:          Prediabetes: 5.7 - 6.4          Diabetes: >6.4          Glycemic control for adults with diabetes: <7.0   TSH     Status: None   Collection Time: 12/27/22  8:30 AM  Result Value Ref Range   TSH 1.890 0.450 - 4.500 uIU/mL  Lipid panel     Status: None   Collection Time: 12/27/22  8:30 AM  Result Value Ref Range   Cholesterol, Total 145 100 -  199 mg/dL   Triglycerides 962 0 - 149 mg/dL   HDL 40 >95 mg/dL   VLDL Cholesterol Cal 24 5 - 40 mg/dL   LDL Chol Calc (NIH) 81 0 - 99 mg/dL   Chol/HDL Ratio 3.6 0.0 - 4.4 ratio    Comment:                                   T. Chol/HDL Ratio                                              Men  Women                               1/2 Avg.Risk  3.4    3.3                                   Avg.Risk  5.0    4.4                                2X Avg.Risk  9.6    7.1                                3X Avg.Risk 23.4   11.0   CMP14+EGFR     Status: Abnormal   Collection Time: 12/27/22  8:30 AM  Result Value Ref Range   Glucose 173 (H) 70 - 99 mg/dL   BUN 9 6 - 24 mg/dL   Creatinine, Ser 2.84 0.57 - 1.00 mg/dL   eGFR 132 >44 WN/UUV/2.53   BUN/Creatinine Ratio 15 9 - 23   Sodium 145 (H) 134 - 144 mmol/L   Potassium 3.8 3.5 - 5.2 mmol/L   Chloride 104 96 - 106 mmol/L   CO2 25 20 - 29 mmol/L   Calcium 9.3 8.7 - 10.2 mg/dL   Total Protein 6.7 6.0 - 8.5 g/dL   Albumin 4.3 3.8 - 4.9 g/dL   Globulin, Total 2.4 1.5 - 4.5 g/dL   Bilirubin Total 1.1 0.0 - 1.2 mg/dL   Alkaline Phosphatase 138 (H) 44 - 121 IU/L   AST 27 0 - 40 IU/L   ALT 33 (H) 0 - 32 IU/L    Assessment/ Plan: Berna Bue here for annual physical exam.   Annual physical exam  New onset type 2 diabetes mellitus (HCC) - Plan: Microalbumin / creatinine urine ratio, Blood Glucose Monitoring Suppl DEVI, Glucose Blood (BLOOD GLUCOSE TEST STRIPS) STRP, Lancet Device MISC, Lancets Misc. MISC, Bayer DCA Hb A1c Waived  Hyperlipidemia associated with type 2 diabetes mellitus (HCC) - Plan: atorvastatin (LIPITOR) 10 MG tablet, Hepatic function panel, Lipid panel  Elevation of levels of liver transaminase levels - Plan: Hepatic function panel  Gastroesophageal reflux disease without esophagitis - Plan: famotidine (PEPCID) 20 MG tablet, omeprazole (PRILOSEC) 20 MG capsule  Seasonal allergic rhinitis due to pollen - Plan: montelukast (SINGULAIR) 10 MG tablet  Urine microalbumin collected today.  I will get her some glucose testing supplies.  Her goal is to keep this diet-controlled going  forward.  I did offer referral to nutrition but she declined this today.  We discussed the importance of  diabetic eye exam, foot exam, yearly urine microalbumin.  We discussed some strategies on how to cut back on carbohydrates.  Handouts were provided today  I renewed her Lipitor.  Will plan for repeat fasting lipid and A1c in 3 months  PPI and Pepcid ordered for GERD  Singulair renewed  Counseled on healthy lifestyle choices, including diet (rich in fruits, vegetables and lean meats and low in salt and simple carbohydrates) and exercise (at least 30 minutes of moderate physical activity daily).  Patient to follow up 63m  Tramon Crescenzo M. Nadine Counts, DO

## 2023-03-24 ENCOUNTER — Other Ambulatory Visit: Payer: Self-pay | Admitting: Family Medicine

## 2023-03-24 DIAGNOSIS — Z0000 Encounter for general adult medical examination without abnormal findings: Secondary | ICD-10-CM

## 2023-04-07 ENCOUNTER — Other Ambulatory Visit: Payer: BC Managed Care – PPO

## 2023-04-07 ENCOUNTER — Encounter: Payer: Self-pay | Admitting: Family Medicine

## 2023-04-07 DIAGNOSIS — R7401 Elevation of levels of liver transaminase levels: Secondary | ICD-10-CM

## 2023-04-07 DIAGNOSIS — E785 Hyperlipidemia, unspecified: Secondary | ICD-10-CM | POA: Diagnosis not present

## 2023-04-07 DIAGNOSIS — E1169 Type 2 diabetes mellitus with other specified complication: Secondary | ICD-10-CM

## 2023-04-07 DIAGNOSIS — E119 Type 2 diabetes mellitus without complications: Secondary | ICD-10-CM

## 2023-04-07 LAB — BAYER DCA HB A1C WAIVED: HB A1C (BAYER DCA - WAIVED): 5.3 % (ref 4.8–5.6)

## 2023-04-08 LAB — HEPATIC FUNCTION PANEL
ALT: 19 IU/L (ref 0–32)
AST: 15 IU/L (ref 0–40)
Albumin: 4.5 g/dL (ref 3.8–4.9)
Alkaline Phosphatase: 124 IU/L — ABNORMAL HIGH (ref 44–121)
Bilirubin Total: 1.4 mg/dL — ABNORMAL HIGH (ref 0.0–1.2)
Bilirubin, Direct: 0.39 mg/dL (ref 0.00–0.40)
Total Protein: 6.7 g/dL (ref 6.0–8.5)

## 2023-04-08 LAB — LIPID PANEL
Chol/HDL Ratio: 3.2 ratio (ref 0.0–4.4)
Cholesterol, Total: 128 mg/dL (ref 100–199)
HDL: 40 mg/dL (ref >39)
LDL Chol Calc (NIH): 64 mg/dL (ref 0–99)
Triglycerides: 133 mg/dL (ref 0–149)
VLDL Cholesterol Cal: 24 mg/dL (ref 5–40)

## 2023-04-14 ENCOUNTER — Encounter: Payer: Self-pay | Admitting: Family Medicine

## 2023-04-14 ENCOUNTER — Ambulatory Visit: Payer: BC Managed Care – PPO | Admitting: Family Medicine

## 2023-04-14 ENCOUNTER — Encounter

## 2023-04-14 VITALS — BP 108/72 | HR 86 | Temp 97.9°F | Ht 67.0 in | Wt 165.6 lb

## 2023-04-14 DIAGNOSIS — E785 Hyperlipidemia, unspecified: Secondary | ICD-10-CM | POA: Diagnosis not present

## 2023-04-14 DIAGNOSIS — R7401 Elevation of levels of liver transaminase levels: Secondary | ICD-10-CM | POA: Diagnosis not present

## 2023-04-14 DIAGNOSIS — E1169 Type 2 diabetes mellitus with other specified complication: Secondary | ICD-10-CM | POA: Diagnosis not present

## 2023-04-14 LAB — BAYER DCA HB A1C WAIVED: HB A1C (BAYER DCA - WAIVED): 5.3 % (ref 4.8–5.6)

## 2023-04-14 NOTE — Progress Notes (Signed)
 Subjective: CC:DM PCP: Raliegh Ip, DO ZOX:[id number] B Trevino is a 58 y.o. female presenting to clinic today for:  1. Type 2 Diabetes with hyperlipidemia:  She has been very strict with diet and efforts to bring down her blood sugar.  She is checking her blood sugar couple times a week.  No hypoglycemic episodes.  Compliant with statin.  There are no preventive care reminders to display for this patient.  Last A1c:  Lab Results  Component Value Date   HGBA1C 5.3 04/07/2023    ROS: No chest, shortness of breath or dizziness   ROS: Per HPI  Allergies  Allergen Reactions   Fluocinolone Rash   Latex Rash   Penicillins Rash   Sulfa Antibiotics Rash   Ciprofloxacin Rash   Past Medical History:  Diagnosis Date   Allergy    GERD (gastroesophageal reflux disease)    History of kidney stones    last stone was March 1,2023 per pt  04/15/2021   Hyperlipidemia    Seasonal allergies     Current Outpatient Medications:    Ascorbic Acid (VITAMIN C) 1000 MG tablet, Take 1,000 mg by mouth daily., Disp: , Rfl:    atorvastatin (LIPITOR) 10 MG tablet, Take 1 tablet (10 mg total) by mouth daily., Disp: 90 tablet, Rfl: 3   azelastine (ASTELIN) 0.1 % nasal spray, Place 1 spray into both nostrils 2 (two) times daily., Disp: 30 mL, Rfl: 12   Blood Glucose Monitoring Suppl DEVI, Check BGs daily E11.9. May substitute to any manufacturer covered by patient's insurance., Disp: 1 each, Rfl: 0   cholecalciferol (VITAMIN D3) 25 MCG (1000 UNIT) tablet, Take 50 mcg by mouth daily., Disp: , Rfl:    Elderberry 500 MG CAPS, Take 50 mg by mouth daily., Disp: , Rfl:    famotidine (PEPCID) 20 MG tablet, Take 1 tablet (20 mg total) by mouth daily., Disp: 90 tablet, Rfl: 3   Fexofenadine HCl (ALLEGRA PO), Take 1 tablet by mouth daily., Disp: , Rfl:    Glucose Blood (BLOOD GLUCOSE TEST STRIPS) STRP, Check BGs daily E11.9.May substitute to any manufacturer covered by patient's insurance., Disp: 100 strip,  Rfl: 3   ibuprofen (ADVIL) 800 MG tablet, Take 1 tablet (800 mg total) by mouth every 8 (eight) hours as needed., Disp: 30 tablet, Rfl: 0   Lancet Device MISC, Check BGs daily E11.9.May substitute to any manufacturer covered by patient's insurance., Disp: 1 each, Rfl: 0   Lancets Misc. MISC, Check BGs daily E11.9.May substitute to any manufacturer covered by patient's insurance., Disp: 100 each, Rfl: 3   LORazepam (ATIVAN) 0.5 MG tablet, Take 1 tablet (0.5 mg total) by mouth daily as needed for anxiety (put on file)., Disp: 30 tablet, Rfl: 3   montelukast (SINGULAIR) 10 MG tablet, Take 1 tablet (10 mg total) by mouth daily., Disp: 90 tablet, Rfl: 3   Omega-3 Fatty Acids (FISH OIL) 1000 MG CAPS, Take 1,200 mg by mouth daily., Disp: , Rfl:    omeprazole (PRILOSEC) 20 MG capsule, Take 1 capsule (20 mg total) by mouth daily., Disp: 90 capsule, Rfl: 3 Social History   Socioeconomic History   Marital status: Married    Spouse name: Not on file   Number of children: 0   Years of education: 12th grade   Highest education level: 12th grade  Occupational History   Occupation: office work    Associate Professor: [employer]  Tobacco Use   Smoking status: Never   Smokeless tobacco: Never  Vaping Use   Vaping status: Never Used  Substance and Sexual Activity   Alcohol use: No   Drug use: No   Sexual activity: Yes    Birth control/protection: Surgical  Other Topics Concern   Not on file  Social History Narrative   Not on file   Social Drivers of Health   Financial Resource Strain: Low Risk  (04/10/2023)   Overall Financial Resource Strain (CARDIA)    Difficulty of Paying Living Expenses: Not hard at all  Food Insecurity: No Food Insecurity (04/10/2023)   Hunger Vital Sign    Worried About Running Out of Food in the Last Year: Never true    Ran Out of Food in the Last Year: Never true  Transportation Needs: No Transportation Needs (04/10/2023)   PRAPARE - Administrator, Civil Service  (Medical): No    Lack of Transportation (Non-Medical): No  Physical Activity: Insufficiently Active (04/10/2023)   Exercise Vital Sign    Days of Exercise per Week: 2 days    Minutes of Exercise per Session: 30 min  Stress: No Stress Concern Present (04/10/2023)   [employer] of Occupational Health - Occupational Stress Questionnaire    Feeling of Stress : Not at all  Social Connections: Socially Integrated (04/10/2023)   Social Connection and Isolation Panel [NHANES]    Frequency of Communication with Friends and Family: More than three times a week    Frequency of Social Gatherings with Friends and Family: Twice a week    Attends Religious Services: More than 4 times per year    Active Member of [employer] or Organizations: No    Attends Engineer, structural: 1 to 4 times per year    Marital Status: Married  Catering manager Violence: Not on file   Family History  Problem Relation Age of Onset   Hypertension Mother    Hyperlipidemia Mother    Heart attack Father    Hypertension Father    Cancer Father        prostate    Diabetes Father    Hypertension Brother    Diabetes Maternal Grandmother    Cancer Maternal Grandfather        lung   Diabetes Paternal Grandfather    Breast cancer Maternal Aunt    Colon cancer Neg Hx     Objective: Office vital signs reviewed. BP 108/72   Pulse 86   Temp 97.9 F (36.6 C)   Ht 5\' 7"  (1.702 m)   Wt 165 lb 9.6 oz (75.1 kg)   SpO2 98%   BMI 25.94 kg/m   Physical Examination:  General: Awake, alert, well nourished, No acute distress HEENT: sclera white, MMM Cardio: regular rate and rhythm, S1S2 heard, no murmurs appreciated Pulm: clear to auscultation bilaterally, no wheezes, rhonchi or rales; normal work of breathing on room air  Recent Results (from the past 2160 hours)  Bayer DCA Hb A1c Waived     Status: None   Collection Time: 04/07/23  8:46 AM  Result Value Ref Range   HB A1C (BAYER DCA - WAIVED) 5.3 4.8 - 5.6 %     Comment:          Prediabetes: 5.7 - 6.4          Diabetes: >6.4          Glycemic control for adults with diabetes: <7.0   Lipid panel     Status: None   Collection Time: 04/07/23  8:48 AM  Result Value Ref Range   Cholesterol, Total 128 100 - 199 mg/dL   Triglycerides 696 0 - 149 mg/dL   HDL 40 >29 mg/dL   VLDL Cholesterol Cal 24 5 - 40 mg/dL   LDL Chol Calc (NIH) 64 0 - 99 mg/dL   Chol/HDL Ratio 3.2 0.0 - 4.4 ratio    Comment:                                   T. Chol/HDL Ratio                                             Men  Women                               1/2 Avg.Risk  3.4    3.3                                   Avg.Risk  5.0    4.4                                2X Avg.Risk  9.6    7.1                                3X Avg.Risk 23.4   11.0   Hepatic function panel     Status: Abnormal   Collection Time: 04/07/23  8:48 AM  Result Value Ref Range   Total Protein 6.7 6.0 - 8.5 g/dL   Albumin 4.5 3.8 - 4.9 g/dL   Bilirubin Total 1.4 (H) 0.0 - 1.2 mg/dL   Bilirubin, Direct 5.28 0.00 - 0.40 mg/dL   Alkaline Phosphatase 124 (H) 44 - 121 IU/L   AST 15 0 - 40 IU/L   ALT 19 0 - 32 IU/L   Assessment/ Plan: 58 y.o. female   Type 2 diabetes mellitus with other specified complication, without long-term current use of insulin (HCC) - Plan: Microalbumin / creatinine urine ratio, CANCELED: Bayer DCA Hb A1c Waived  Hyperlipidemia associated with type 2 diabetes mellitus (HCC)  Elevation of levels of liver transaminase levels  Sugar under excellent control now with A1c down to 5.3 from 6.8.  I congratulated her on this.  Urine microalbumin collected.  Cholesterol under control with good reduction in liver enzymes compared to previous  She may follow-up in 6 months, sooner if concerns arise  Gorden Stthomas Hulen Skains, DO [facility] [phone]

## 2023-04-16 LAB — MICROALBUMIN / CREATININE URINE RATIO
Creatinine, Urine: 128.9 mg/dL
Microalb/Creat Ratio: 8 mg/g{creat} (ref 0–29)
Microalbumin, Urine: 9.8 ug/mL

## 2023-05-05 ENCOUNTER — Ambulatory Visit

## 2023-05-08 ENCOUNTER — Ambulatory Visit
Admission: RE | Admit: 2023-05-08 | Discharge: 2023-05-08 | Disposition: A | Discharge status: Home / Self Care | Source: Ambulatory Visit | Attending: Family Medicine

## 2023-05-08 DIAGNOSIS — Z1231 Encounter for screening mammogram for malignant neoplasm of breast: Secondary | ICD-10-CM | POA: Diagnosis not present

## 2023-05-08 DIAGNOSIS — Z0000 Encounter for general adult medical examination without abnormal findings: Secondary | ICD-10-CM

## 2023-06-29 ENCOUNTER — Ambulatory Visit: Payer: Self-pay

## 2023-06-29 ENCOUNTER — Telehealth: Payer: Self-pay

## 2023-06-29 NOTE — Telephone Encounter (Signed)
 Copied from CRM [id number]. Topic: Clinical - Medical Advice >> Jun 29, 2023  9:21 AM Carlatta H wrote: Reason for CRM: Patient has had sinus infection for about 1 week and would like to have a mediation called//Please call and advise [phone]

## 2023-06-29 NOTE — Telephone Encounter (Signed)
 Copied from CRM [id number]. Topic: Clinical - Medical Advice >> Jun 29, 2023  9:21 AM Carlatta H wrote: Reason for CRM: Patient has had sinus infection for about 1 week and would like to have a mediation called//Please call and advise [phone] >> Jun 29, 2023  4:55 PM Tiffany H wrote: Patient called to request an appointment to be seen regarding a persistent sinus infection. First available appointment is 07/06/23. Patient wants to be seen tomorrow.  >> Jun 29, 2023  3:40 PM Felizardo Hotter wrote: Pt wants medication sent to:  [facility] - [facility], [place] - [address]  [address] [place] [address]  Phone: [phone] Fax: [phone]     Reason for Disposition  [1] Sinus congestion (pressure, fullness) AND [2] present > 10 days    1 week, appointment for 6/23  Answer Assessment - Initial Assessment Questions 1. LOCATION: Where does it hurt?      Around nasal area  2. ONSET: When did the sinus pain start?  (e.g., hours, days)      1 week  3. SEVERITY: How bad is the pain?   (Scale 1-10; mild, moderate or severe)   - MILD (1-3): doesn't interfere with normal activities    - MODERATE (4-7): interferes with normal activities (e.g., work or school) or awakens from sleep   - SEVERE (8-10): excruciating pain and patient unable to do any normal activities        Moderate  4. RECURRENT SYMPTOM: Have you ever had sinus problems before? If Yes, ask: When was the last time? and What happened that time?      Yes, believes it is a sinus infection  5. NASAL CONGESTION: Is the nose blocked? If Yes, ask: Can you open it or must you breathe through your mouth?     Yes 6. NASAL DISCHARGE: Do you have discharge from your nose? If so ask, What color?     No 7. FEVER: Do you have a fever? If Yes, ask: What is it, how was it measured, and when did it start?      No 8. OTHER SYMPTOMS: Do you have any other symptoms? (e.g., sore throat, cough, earache, difficulty  breathing)     Sore throat  Protocols used: Sinus Pain or Congestion-A-AH    FYI Only or Action Required?: FYI only for provider.  Patient was last seen in primary care on 04/14/2023 by Eliodoro Guerin, DO. Called Nurse Triage reporting Facial Pain. Symptoms began a week ago. ISymptoms are: gradually worsening.  Triage Disposition: See PCP When Office is Open (Within 3 Days)  Patient/caregiver understands and will follow disposition?: Yes

## 2023-06-29 NOTE — Telephone Encounter (Signed)
Left message for patient to call back and make an appt.

## 2023-06-30 NOTE — Telephone Encounter (Signed)
 Appoinment canceled. Documentation states feeling better. LS

## 2023-07-03 ENCOUNTER — Ambulatory Visit: Admitting: Family

## 2023-10-06 DIAGNOSIS — H31091 Other chorioretinal scars, right eye: Secondary | ICD-10-CM | POA: Diagnosis not present

## 2023-10-09 ENCOUNTER — Telehealth: Payer: Self-pay | Admitting: Family Medicine

## 2023-10-09 DIAGNOSIS — F411 Generalized anxiety disorder: Secondary | ICD-10-CM

## 2023-10-09 DIAGNOSIS — E782 Mixed hyperlipidemia: Secondary | ICD-10-CM

## 2023-10-09 NOTE — Telephone Encounter (Signed)
 Patient has labs 10-3 for Dr. KANDICE and needs orders.

## 2023-10-09 NOTE — Telephone Encounter (Signed)
 Future orders placed

## 2023-10-13 ENCOUNTER — Other Ambulatory Visit

## 2023-10-13 DIAGNOSIS — E782 Mixed hyperlipidemia: Secondary | ICD-10-CM

## 2023-10-13 DIAGNOSIS — F410 Panic disorder [episodic paroxysmal anxiety] without agoraphobia: Secondary | ICD-10-CM | POA: Diagnosis not present

## 2023-10-13 DIAGNOSIS — R739 Hyperglycemia, unspecified: Secondary | ICD-10-CM | POA: Diagnosis not present

## 2023-10-13 DIAGNOSIS — F411 Generalized anxiety disorder: Secondary | ICD-10-CM | POA: Diagnosis not present

## 2023-10-13 LAB — LIPID PANEL

## 2023-10-14 LAB — CBC WITH DIFFERENTIAL/PLATELET
Basophils Absolute: 0 x10E3/uL (ref 0.0–0.2)
Basos: 1 %
EOS (ABSOLUTE): 0.1 x10E3/uL (ref 0.0–0.4)
Eos: 1 %
Hematocrit: 40.2 % (ref 34.0–46.6)
Hemoglobin: 13.4 g/dL (ref 11.1–15.9)
Immature Grans (Abs): 0 x10E3/uL (ref 0.0–0.1)
Immature Granulocytes: 0 %
Lymphocytes Absolute: 2.2 x10E3/uL (ref 0.7–3.1)
Lymphs: 36 %
MCH: 31.7 pg (ref 26.6–33.0)
MCHC: 33.3 g/dL (ref 31.5–35.7)
MCV: 95 fL (ref 79–97)
Monocytes Absolute: 0.3 x10E3/uL (ref 0.1–0.9)
Monocytes: 5 %
Neutrophils Absolute: 3.5 x10E3/uL (ref 1.4–7.0)
Neutrophils: 57 %
Platelets: 195 x10E3/uL (ref 150–450)
RBC: 4.23 x10E6/uL (ref 3.77–5.28)
RDW: 12.7 % (ref 11.7–15.4)
WBC: 6.2 x10E3/uL (ref 3.4–10.8)

## 2023-10-14 LAB — CMP14+EGFR
ALT: 17 IU/L (ref 0–32)
AST: 15 IU/L (ref 0–40)
Albumin: 4.4 g/dL (ref 3.8–4.9)
Alkaline Phosphatase: 112 IU/L (ref 49–135)
BUN/Creatinine Ratio: 14 (ref 12–28)
BUN: 9 mg/dL (ref 8–27)
Bilirubin Total: 1.2 mg/dL (ref 0.0–1.2)
CO2: 25 mmol/L (ref 20–29)
Calcium: 9.2 mg/dL (ref 8.7–10.3)
Chloride: 105 mmol/L (ref 96–106)
Creatinine, Ser: 0.63 mg/dL (ref 0.57–1.00)
Globulin, Total: 1.8 g/dL (ref 1.5–4.5)
Glucose: 134 mg/dL — ABNORMAL HIGH (ref 70–99)
Potassium: 3.6 mmol/L (ref 3.5–5.2)
Sodium: 144 mmol/L (ref 134–144)
Total Protein: 6.2 g/dL (ref 6.0–8.5)
eGFR: 101 mL/min/1.73 (ref >59)

## 2023-10-14 LAB — LIPID PANEL
Chol/HDL Ratio: 3.1 ratio (ref 0.0–4.4)
Cholesterol, Total: 122 mg/dL (ref 100–199)
HDL: 40 mg/dL (ref >39)
LDL Chol Calc (NIH): 61 mg/dL (ref 0–99)
Triglycerides: 113 mg/dL (ref 0–149)
VLDL Cholesterol Cal: 21 mg/dL (ref 5–40)

## 2023-10-16 ENCOUNTER — Ambulatory Visit: Payer: Self-pay | Admitting: Family Medicine

## 2023-10-16 DIAGNOSIS — D225 Melanocytic nevi of trunk: Secondary | ICD-10-CM | POA: Diagnosis not present

## 2023-10-16 DIAGNOSIS — Z85828 Personal history of other malignant neoplasm of skin: Secondary | ICD-10-CM | POA: Diagnosis not present

## 2023-10-16 DIAGNOSIS — D2239 Melanocytic nevi of other parts of face: Secondary | ICD-10-CM | POA: Diagnosis not present

## 2023-10-16 DIAGNOSIS — D2261 Melanocytic nevi of right upper limb, including shoulder: Secondary | ICD-10-CM | POA: Diagnosis not present

## 2023-10-17 LAB — HGB A1C W/O EAG: Hgb A1c MFr Bld: 5.7 % — ABNORMAL HIGH (ref 4.8–5.6)

## 2023-10-17 LAB — SPECIMEN STATUS REPORT

## 2023-10-20 ENCOUNTER — Encounter: Payer: Self-pay | Admitting: Family Medicine

## 2023-10-20 ENCOUNTER — Ambulatory Visit: Admitting: Family Medicine

## 2023-10-20 VITALS — BP 126/75 | HR 92 | Temp 97.7°F | Ht 67.0 in | Wt 169.5 lb

## 2023-10-20 DIAGNOSIS — E1169 Type 2 diabetes mellitus with other specified complication: Secondary | ICD-10-CM | POA: Diagnosis not present

## 2023-10-20 DIAGNOSIS — K219 Gastro-esophageal reflux disease without esophagitis: Secondary | ICD-10-CM | POA: Diagnosis not present

## 2023-10-20 DIAGNOSIS — E785 Hyperlipidemia, unspecified: Secondary | ICD-10-CM

## 2023-10-20 DIAGNOSIS — Z23 Encounter for immunization: Secondary | ICD-10-CM | POA: Diagnosis not present

## 2023-10-20 DIAGNOSIS — J301 Allergic rhinitis due to pollen: Secondary | ICD-10-CM

## 2023-10-20 MED ORDER — MONTELUKAST SODIUM 10 MG PO TABS: 10.0000 mg | ORAL_TABLET | Freq: Every day | ORAL | 3 refills | Status: AC

## 2023-10-20 MED ORDER — ATORVASTATIN CALCIUM 10 MG PO TABS: 10.0000 mg | ORAL_TABLET | Freq: Every day | ORAL | 3 refills | Status: AC

## 2023-10-20 MED ORDER — OMEPRAZOLE 20 MG PO CPDR
20.0000 mg | DELAYED_RELEASE_CAPSULE | Freq: Every day | ORAL | 3 refills | Status: AC
Start: 2023-10-20 — End: ?

## 2023-10-20 NOTE — Progress Notes (Signed)
 Subjective: CC:DM PCP: Jolinda Summer HERO, DO YEP:Summer Brady is a 58 y.o. female presenting to clinic today for:  Type 2 Diabetes with hyperlipidemia:  She is checking blood sugars and they are typically running below 130s fasting. Continues to watch her diet closely and has limited unnecessary sugars.  She reports feeling well ROS: Denies dizziness, LOC, polyuria, polydipsia, unintended weight loss/gain, foot ulcerations, numbness or tingling in extremities, shortness of breath or chest pain.   There are no preventive care reminders to display for this patient.  ROS: Per HPI  Allergies  Allergen Reactions   Fluocinolone Rash   Latex Rash   Penicillins Rash   Sulfa Antibiotics Rash   Ciprofloxacin Rash   Past Medical History:  Diagnosis Date   Allergy    GERD (gastroesophageal reflux disease)    History of kidney stones    last stone was March 1,2023 per pt  04/15/2021   Hyperlipidemia    Seasonal allergies     Current Outpatient Medications:    Ascorbic Acid (VITAMIN C) 1000 MG tablet, Take 1,000 mg by mouth daily., Disp: , Rfl:    atorvastatin  (LIPITOR) 10 MG tablet, Take 1 tablet (10 mg total) by mouth daily., Disp: 90 tablet, Rfl: 3   Blood Glucose Monitoring Suppl DEVI, Check BGs daily E11.9. May substitute to any manufacturer covered by patient's insurance., Disp: 1 each, Rfl: 0   cholecalciferol (VITAMIN D3) 25 MCG (1000 UNIT) tablet, Take 50 mcg by mouth daily., Disp: , Rfl:    Elderberry 500 MG CAPS, Take 50 mg by mouth daily., Disp: , Rfl:    Fexofenadine HCl (ALLEGRA PO), Take 1 tablet by mouth daily., Disp: , Rfl:    Glucose Blood (BLOOD GLUCOSE TEST STRIPS) STRP, Check BGs daily E11.9.May substitute to any manufacturer covered by patient's insurance., Disp: 100 strip, Rfl: 3   Lancet Device MISC, Check BGs daily E11.9.May substitute to any manufacturer covered by patient's insurance., Disp: 1 each, Rfl: 0   Lancets Misc. MISC, Check BGs daily E11.9.May  substitute to any manufacturer covered by patient's insurance., Disp: 100 each, Rfl: 3   LORazepam  (ATIVAN ) 0.5 MG tablet, Take 1 tablet (0.5 mg total) by mouth daily as needed for anxiety (put on file)., Disp: 30 tablet, Rfl: 3   montelukast  (SINGULAIR ) 10 MG tablet, Take 1 tablet (10 mg total) by mouth daily., Disp: 90 tablet, Rfl: 3   Omega-3 Fatty Acids (FISH OIL) 1000 MG CAPS, Take 1,200 mg by mouth daily., Disp: , Rfl:    omeprazole  (PRILOSEC) 20 MG capsule, Take 1 capsule (20 mg total) by mouth daily., Disp: 90 capsule, Rfl: 3 Social History   Socioeconomic History   Marital status: Married    Spouse name: Not on file   Number of children: 0   Years of education: 12th grade   Highest education level: 12th grade  Occupational History   Occupation: office work    Associate Professor: [employer]  Tobacco Use   Smoking status: Never   Smokeless tobacco: Never  Vaping Use   Vaping status: Never Used  Substance and Sexual Activity   Alcohol use: No   Drug use: No   Sexual activity: Yes    Birth control/protection: Surgical  Other Topics Concern   Not on file  Social History Narrative   Not on file   Social Drivers of Health   Financial Resource Strain: Low Risk  (10/16/2023)   Overall Financial Resource Strain (CARDIA)    Difficulty of  Paying Living Expenses: Not hard at all  Food Insecurity: No Food Insecurity (10/16/2023)   Hunger Vital Sign    Worried About Running Out of Food in the Last Year: Never true    Ran Out of Food in the Last Year: Never true  Transportation Needs: No Transportation Needs (10/16/2023)   PRAPARE - Administrator, Civil Service (Medical): No    Lack of Transportation (Non-Medical): No  Physical Activity: Insufficiently Active (10/16/2023)   Exercise Vital Sign    Days of Exercise per Week: 2 days    Minutes of Exercise per Session: 40 min  Stress: No Stress Concern Present (10/16/2023)   [employer] of Occupational Health -  Occupational Stress Questionnaire    Feeling of Stress: Not at all  Social Connections: Socially Integrated (10/16/2023)   Social Connection and Isolation Panel    Frequency of Communication with Friends and Family: More than three times a week    Frequency of Social Gatherings with Friends and Family: More than three times a week    Attends Religious Services: More than 4 times per year    Active Member of [employer] or Organizations: Yes    Attends Engineer, structural: More than 4 times per year    Marital Status: Married  Catering manager Violence: Not on file   Family History  Problem Relation Age of Onset   Hypertension Mother    Hyperlipidemia Mother    Heart attack Father    Hypertension Father    Cancer Father        prostate    Diabetes Father    Hypertension Brother    Diabetes Maternal Grandmother    Cancer Maternal Grandfather        lung   Diabetes Paternal Grandfather    Breast cancer Maternal Aunt    Colon cancer Neg Hx     Objective: Office vital signs reviewed. BP 126/75   Pulse 92   Temp 97.7 F (36.5 C)   Ht 5' 7 (1.702 m)   Wt 169 lb 8 oz (76.9 kg)   SpO2 96%   BMI 26.55 kg/m   Physical Examination:  General: Awake, alert, well nourished, No acute distress HEENT: sclera white, MMM Cardio: regular rate and rhythm, S1S2 heard, no murmurs appreciated Pulm: clear to auscultation bilaterally, no wheezes, rhonchi or rales; normal work of breathing on room air Extremities: warm, well perfused, No edema, cyanosis or clubbing; +2 pulses bilaterally    Lab Results  Component Value Date   HGBA1C 5.7 (H) 10/13/2023    Assessment/ Plan: 58 y.o. female   Type 2 diabetes mellitus with other specified complication, without long-term current use of insulin (HCC)  Hyperlipidemia associated with type 2 diabetes mellitus (HCC) - Plan: atorvastatin  (LIPITOR) 10 MG tablet  Seasonal allergic rhinitis due to pollen - Plan: montelukast  (SINGULAIR ) 10 MG  tablet  Gastroesophageal reflux disease without esophagitis - Plan: omeprazole  (PRILOSEC) 20 MG capsule  Sugar WELL controlled with A1c 5.7. continue low carb diet to control BGs  Continue statin. LDL at goal  Did not discuss GERD/ Allergies but refills needed prior to next OV.  PNA shot administered   Summer CHRISTELLA Fielding, DO [facility] [phone]

## 2023-10-31 DIAGNOSIS — H00022 Hordeolum internum right lower eyelid: Secondary | ICD-10-CM | POA: Diagnosis not present

## 2023-12-01 ENCOUNTER — Encounter: Payer: Self-pay | Admitting: Family Medicine

## 2023-12-26 DIAGNOSIS — Z01419 Encounter for gynecological examination (general) (routine) without abnormal findings: Secondary | ICD-10-CM | POA: Diagnosis not present

## 2023-12-26 DIAGNOSIS — Z1331 Encounter for screening for depression: Secondary | ICD-10-CM | POA: Diagnosis not present

## 2023-12-27 DIAGNOSIS — H00015 Hordeolum externum left lower eyelid: Secondary | ICD-10-CM | POA: Diagnosis not present

## 2023-12-29 ENCOUNTER — Other Ambulatory Visit (HOSPITAL_BASED_OUTPATIENT_CLINIC_OR_DEPARTMENT_OTHER): Payer: Self-pay | Admitting: Obstetrics & Gynecology

## 2023-12-29 DIAGNOSIS — M8580 Other specified disorders of bone density and structure, unspecified site: Secondary | ICD-10-CM

## 2024-01-08 ENCOUNTER — Telehealth: Payer: Self-pay

## 2024-01-08 NOTE — Telephone Encounter (Signed)
 Received message from E2C2 patient feeling sick on 01/06/24. Called pt and she states she is feeling better and nothing is needed at this time.

## 2024-01-17 ENCOUNTER — Telehealth: Payer: Self-pay

## 2024-01-17 ENCOUNTER — Other Ambulatory Visit: Payer: Self-pay | Admitting: *Deleted

## 2024-01-17 DIAGNOSIS — Z0000 Encounter for general adult medical examination without abnormal findings: Secondary | ICD-10-CM

## 2024-01-17 DIAGNOSIS — E1169 Type 2 diabetes mellitus with other specified complication: Secondary | ICD-10-CM

## 2024-01-17 DIAGNOSIS — E782 Mixed hyperlipidemia: Secondary | ICD-10-CM

## 2024-01-17 NOTE — Telephone Encounter (Signed)
 No need to do full metabolic labs.  She is really just coming in for A1c recheck as she was in prediabetic level.  Okay to keep the CMP just to see what her fasting blood sugar is but do not need the CBC or the lipid panel as those were normal last visit

## 2024-01-17 NOTE — Telephone Encounter (Signed)
 Copied from CRM #[id number]. Topic: Clinical - Request for Lab/Test Order >> Jan 17, 2024 10:44 AM Delon DASEN wrote: Reason for CRM: patient wants to schedule labs for April 10 but  no orders in chart

## 2024-01-17 NOTE — Addendum Note (Signed)
 Addended by: MILAS CONGRESS D on: 01/17/2024 03:35 PM   Modules accepted: Orders

## 2024-01-17 NOTE — Telephone Encounter (Signed)
 Appointment made, labs ordered. Please review to see if additional labs need to be ordered.

## 2024-04-19 ENCOUNTER — Other Ambulatory Visit

## 2024-04-26 ENCOUNTER — Encounter: Payer: Self-pay | Admitting: Family Medicine
